# Patient Record
Sex: Male | Born: 1950 | Race: White | Hispanic: No | Marital: Married | Smoking: Never smoker
Health system: Southern US, Community
[De-identification: ages and names within clinical notes are randomized; demographics above are authoritative.]

## PROBLEM LIST (undated history)

## (undated) DIAGNOSIS — M1990 Unspecified osteoarthritis, unspecified site: Secondary | ICD-10-CM

## (undated) DIAGNOSIS — F32A Depression, unspecified: Secondary | ICD-10-CM

## (undated) DIAGNOSIS — F329 Major depressive disorder, single episode, unspecified: Secondary | ICD-10-CM

## (undated) HISTORY — DX: Depression, unspecified: F32.A

## (undated) HISTORY — DX: Major depressive disorder, single episode, unspecified: F32.9

## (undated) HISTORY — DX: Unspecified osteoarthritis, unspecified site: M19.90

## (undated) HISTORY — PX: APPENDECTOMY: SHX54

---

## 2005-07-12 ENCOUNTER — Ambulatory Visit: Payer: Self-pay | Admitting: Family Medicine

## 2006-03-14 ENCOUNTER — Ambulatory Visit: Payer: Self-pay | Admitting: Family Medicine

## 2008-09-30 ENCOUNTER — Ambulatory Visit: Payer: Self-pay | Admitting: Family Medicine

## 2008-09-30 DIAGNOSIS — F329 Major depressive disorder, single episode, unspecified: Secondary | ICD-10-CM

## 2008-09-30 DIAGNOSIS — S139XXA Sprain of joints and ligaments of unspecified parts of neck, initial encounter: Secondary | ICD-10-CM

## 2008-09-30 DIAGNOSIS — J45909 Unspecified asthma, uncomplicated: Secondary | ICD-10-CM | POA: Insufficient documentation

## 2008-09-30 DIAGNOSIS — IMO0002 Reserved for concepts with insufficient information to code with codable children: Secondary | ICD-10-CM | POA: Insufficient documentation

## 2008-11-20 ENCOUNTER — Ambulatory Visit: Payer: Self-pay | Admitting: Family Medicine

## 2008-11-20 DIAGNOSIS — M1990 Unspecified osteoarthritis, unspecified site: Secondary | ICD-10-CM | POA: Insufficient documentation

## 2009-09-04 ENCOUNTER — Telehealth: Payer: Self-pay | Admitting: Family Medicine

## 2011-01-02 LAB — CONVERTED CEMR LAB
ALT: 30 units/L (ref 0–53)
AST: 31 units/L (ref 0–37)
Albumin: 4.4 g/dL (ref 3.5–5.2)
Alkaline Phosphatase: 67 units/L (ref 39–117)
BUN: 16 mg/dL (ref 6–23)
Basophils Relative: 0.1 % (ref 0.0–3.0)
CO2: 32 meq/L (ref 19–32)
Chloride: 104 meq/L (ref 96–112)
Creatinine, Ser: 0.9 mg/dL (ref 0.4–1.5)
Eosinophils Absolute: 0.1 10*3/uL (ref 0.0–0.7)
Eosinophils Relative: 2.6 % (ref 0.0–5.0)
GFR calc non Af Amer: 93 mL/min
Glucose, Bld: 105 mg/dL — ABNORMAL HIGH (ref 70–99)
HCT: 42.2 % (ref 39.0–52.0)
MCV: 95.8 fL (ref 78.0–100.0)
Monocytes Relative: 10.3 % (ref 3.0–12.0)
Neutrophils Relative %: 66.8 % (ref 43.0–77.0)
PSA: 0.95 ng/mL (ref 0.10–4.00)
Platelets: 211 10*3/uL (ref 150–400)
Potassium: 4.4 meq/L (ref 3.5–5.1)
RBC: 4.41 M/uL (ref 4.22–5.81)
Total CHOL/HDL Ratio: 3.1
Total Protein: 7.5 g/dL (ref 6.0–8.3)
VLDL: 8 mg/dL (ref 0–40)
WBC: 5.4 10*3/uL (ref 4.5–10.5)

## 2011-01-07 ENCOUNTER — Encounter: Payer: Self-pay | Admitting: Family Medicine

## 2011-01-07 ENCOUNTER — Ambulatory Visit (INDEPENDENT_AMBULATORY_CARE_PROVIDER_SITE_OTHER): Payer: BC Managed Care – PPO | Admitting: Family Medicine

## 2011-01-07 VITALS — BP 126/84 | HR 68 | Temp 98.3°F | Wt 164.0 lb

## 2011-01-07 DIAGNOSIS — J40 Bronchitis, not specified as acute or chronic: Secondary | ICD-10-CM

## 2011-01-07 MED ORDER — AZITHROMYCIN 250 MG PO TABS: ORAL_TABLET | ORAL | Status: AC

## 2011-01-07 NOTE — Progress Notes (Signed)
  Subjective:    Patient ID: Donald Melendez, male    DOB: 04-18-1951, 60 y.o.   MRN: [id number]  HPI    Review of Systems     Objective:   Physical Exam        Assessment & Plan:

## 2011-02-25 ENCOUNTER — Ambulatory Visit (INDEPENDENT_AMBULATORY_CARE_PROVIDER_SITE_OTHER): Payer: BC Managed Care – PPO | Admitting: Family Medicine

## 2011-02-25 ENCOUNTER — Encounter: Payer: Self-pay | Admitting: Family Medicine

## 2011-02-25 DIAGNOSIS — J45909 Unspecified asthma, uncomplicated: Secondary | ICD-10-CM

## 2011-02-25 MED ORDER — ALBUTEROL SULFATE HFA 108 (90 BASE) MCG/ACT IN AERS: 2.0000 | INHALATION_SPRAY | RESPIRATORY_TRACT | Status: DC | PRN

## 2011-02-25 MED ORDER — FLUTICASONE PROPIONATE HFA 220 MCG/ACT IN AERO: 2.0000 | INHALATION_SPRAY | Freq: Two times a day (BID) | RESPIRATORY_TRACT | Status: DC

## 2011-02-25 NOTE — Progress Notes (Signed)
  Subjective:    Patient ID: Donald Melendez, male    DOB: 11-01-1951, 60 y.o.   MRN: [id number]  HPI Here for med refills for his asthma. He has not used prescription inhalers or over a year. He used Primatene OTC for awhile last year until this was taken off the market. He has been getting mildly SOB when he works outside lately and has a dry cough. No chest pain.    Review of Systems  Constitutional: Negative.   Respiratory: Positive for cough, chest tightness, shortness of breath and wheezing. Negative for apnea, choking and stridor.   Cardiovascular: Negative.        Objective:   Physical Exam  Constitutional: He appears well-developed and well-nourished.  Cardiovascular: Normal rate, regular rhythm, normal heart sounds and intact distal pulses.  Exam reveals no gallop and no friction rub.   No murmur heard. Pulmonary/Chest: Effort normal and breath sounds normal. No respiratory distress. He has no wheezes. He has no rales. He exhibits no tenderness.          Assessment & Plan:  Get back on adequate meds for the asthma. Set up a cpx soon

## 2016-10-09 ENCOUNTER — Emergency Department (HOSPITAL_COMMUNITY)
Admission: EM | Admit: 2016-10-09 | Discharge: 2016-10-09 | Disposition: A | Discharge status: Home / Self Care | Payer: Self-pay | Attending: Emergency Medicine | Admitting: Emergency Medicine

## 2016-10-09 ENCOUNTER — Emergency Department (HOSPITAL_COMMUNITY): Payer: Self-pay

## 2016-10-09 ENCOUNTER — Encounter (HOSPITAL_COMMUNITY): Payer: Self-pay | Admitting: Emergency Medicine

## 2016-10-09 DIAGNOSIS — J45909 Unspecified asthma, uncomplicated: Secondary | ICD-10-CM | POA: Insufficient documentation

## 2016-10-09 DIAGNOSIS — R0602 Shortness of breath: Secondary | ICD-10-CM | POA: Insufficient documentation

## 2016-10-09 DIAGNOSIS — Y929 Unspecified place or not applicable: Secondary | ICD-10-CM | POA: Insufficient documentation

## 2016-10-09 DIAGNOSIS — W19XXXA Unspecified fall, initial encounter: Secondary | ICD-10-CM

## 2016-10-09 DIAGNOSIS — W11XXXA Fall on and from ladder, initial encounter: Secondary | ICD-10-CM | POA: Insufficient documentation

## 2016-10-09 DIAGNOSIS — Y999 Unspecified external cause status: Secondary | ICD-10-CM | POA: Insufficient documentation

## 2016-10-09 DIAGNOSIS — R071 Chest pain on breathing: Secondary | ICD-10-CM | POA: Insufficient documentation

## 2016-10-09 DIAGNOSIS — Z79899 Other long term (current) drug therapy: Secondary | ICD-10-CM | POA: Insufficient documentation

## 2016-10-09 DIAGNOSIS — Y939 Activity, unspecified: Secondary | ICD-10-CM | POA: Insufficient documentation

## 2016-10-09 LAB — SAMPLE TO BLOOD BANK

## 2016-10-09 LAB — I-STAT CHEM 8, ED
BUN: 14 mg/dL (ref 6–20)
CALCIUM ION: 1.13 mmol/L — AB (ref 1.15–1.40)
CHLORIDE: 101 mmol/L (ref 101–111)
CREATININE: 1.1 mg/dL (ref 0.61–1.24)
GLUCOSE: 123 mg/dL — AB (ref 65–99)
HCT: 42 % (ref 39.0–52.0)
Hemoglobin: 14.3 g/dL (ref 13.0–17.0)
Potassium: 3.5 mmol/L (ref 3.5–5.1)
Sodium: 142 mmol/L (ref 135–145)
TCO2: 27 mmol/L (ref 0–100)

## 2016-10-09 LAB — CBC
HCT: 39.9 % (ref 39.0–52.0)
HEMOGLOBIN: 14 g/dL (ref 13.0–17.0)
MCH: 32.8 pg (ref 26.0–34.0)
MCHC: 35.1 g/dL (ref 30.0–36.0)
MCV: 93.4 fL (ref 78.0–100.0)
PLATELETS: 168 10*3/uL (ref 150–400)
RBC: 4.27 MIL/uL (ref 4.22–5.81)
RDW: 12.8 % (ref 11.5–15.5)
WBC: 5.7 10*3/uL (ref 4.0–10.5)

## 2016-10-09 LAB — COMPREHENSIVE METABOLIC PANEL
ALK PHOS: 62 U/L (ref 38–126)
ALT: 42 U/L (ref 17–63)
ANION GAP: 8 (ref 5–15)
AST: 47 U/L — ABNORMAL HIGH (ref 15–41)
Albumin: 4.6 g/dL (ref 3.5–5.0)
BILIRUBIN TOTAL: 0.6 mg/dL (ref 0.3–1.2)
BUN: 14 mg/dL (ref 6–20)
CALCIUM: 9.1 mg/dL (ref 8.9–10.3)
CO2: 27 mmol/L (ref 22–32)
CREATININE: 1.03 mg/dL (ref 0.61–1.24)
Chloride: 104 mmol/L (ref 101–111)
Glucose, Bld: 125 mg/dL — ABNORMAL HIGH (ref 65–99)
Potassium: 3.4 mmol/L — ABNORMAL LOW (ref 3.5–5.1)
SODIUM: 139 mmol/L (ref 135–145)
TOTAL PROTEIN: 7.9 g/dL (ref 6.5–8.1)

## 2016-10-09 LAB — URINALYSIS, ROUTINE W REFLEX MICROSCOPIC
Bilirubin Urine: NEGATIVE
Glucose, UA: NEGATIVE mg/dL
KETONES UR: NEGATIVE mg/dL
LEUKOCYTES UA: NEGATIVE
NITRITE: NEGATIVE
PROTEIN: NEGATIVE mg/dL
Specific Gravity, Urine: 1.015 (ref 1.005–1.030)
pH: 6 (ref 5.0–8.0)

## 2016-10-09 LAB — URINE MICROSCOPIC-ADD ON

## 2016-10-09 LAB — I-STAT CG4 LACTIC ACID, ED: LACTIC ACID, VENOUS: 1.32 mmol/L (ref 0.5–1.9)

## 2016-10-09 LAB — ETHANOL

## 2016-10-09 LAB — PROTIME-INR
INR: 0.88
Prothrombin Time: 11.9 seconds (ref 11.4–15.2)

## 2016-10-09 MED ORDER — OXYCODONE-ACETAMINOPHEN 5-325 MG PO TABS: 1.0000 | ORAL_TABLET | ORAL | 0 refills | Status: DC | PRN

## 2016-10-09 MED ORDER — SODIUM CHLORIDE 0.9 % IV SOLN: INTRAVENOUS | Status: DC

## 2016-10-09 MED ORDER — SODIUM CHLORIDE 0.9 % IV BOLUS (SEPSIS)
1000.0000 mL | Freq: Once | INTRAVENOUS | Status: AC
  Administered 2016-10-09: 1000 mL via INTRAVENOUS

## 2016-10-09 MED ORDER — HYDROMORPHONE HCL 1 MG/ML IJ SOLN
1.0000 mg | Freq: Once | INTRAMUSCULAR | Status: AC
  Administered 2016-10-09: 1 mg via INTRAVENOUS
  Filled 2016-10-09: qty 1

## 2016-10-09 MED ORDER — IOPAMIDOL (ISOVUE-300) INJECTION 61%
75.0000 mL | Freq: Once | INTRAVENOUS | Status: AC | PRN
  Administered 2016-10-09: 75 mL via INTRAVENOUS

## 2016-10-09 MED ORDER — OXYCODONE-ACETAMINOPHEN 5-325 MG PO TABS
1.0000 | ORAL_TABLET | Freq: Once | ORAL | Status: AC
Start: 2016-10-09 — End: 2016-10-09
  Administered 2016-10-09: 1 via ORAL
  Filled 2016-10-09: qty 1

## 2016-10-09 NOTE — ED Triage Notes (Signed)
Pt reports L chest pain (with mvmt) after falling off a ladder. Family reports pt fell on his L side.

## 2016-10-09 NOTE — ED Provider Notes (Signed)
[facility] Provider Note   CSN: [id number] Arrival date & time: 10/09/16  1729     History   Chief Complaint Chief Complaint  Patient presents with  . Fall    HPI Donald Melendez is a 65 y.o. male With a history of asthma and osteoarthritis who presents with a fall. Patient reports that he was on an 8 foot ladder when he fell from the top. He reports that he landed on to soft ground onto his left chest. He denies loss of consciousness or hitting his head. His primary complaint is pain in the left torso. Patient denies headache, vision changes, abdominal pain, extremity pain, lightheadedness. He does report shortness of breath and severe pain with deep breathing. He denies any history of fractures or serious trauma. He has not taken any medicine and came straight to the emergency department. He denies any anticoagulation use. 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     The history is provided by the patient, the spouse and medical records. No language interpreter was used.  Fall  This is a new problem. The current episode started less than 1 hour ago. The problem occurs rarely. The problem has not changed since onset.Associated symptoms include chest pain and shortness of breath. Pertinent negatives include no abdominal pain and no headaches. The symptoms are aggravated by bending and coughing. The symptoms are relieved by rest. He has tried nothing for the symptoms. The treatment provided no relief.    Past Medical History:  Diagnosis Date  . Asthma   . Depression   . Osteoarthritis     Patient Active Problem List   Diagnosis Date Noted  . OSTEOARTHRITIS 11/20/2008  . DEPRESSION 09/30/2008  . ASTHMA 09/30/2008  . KNEE SPRAIN 09/30/2008  . NECK SPRAIN AND STRAIN 09/30/2008    Past Surgical History:  Procedure Laterality Date  . APPENDECTOMY         Home Medications    Prior to Admission medications     Medication Sig Start Date End Date Taking? Authorizing Provider  albuterol (VENTOLIN HFA) 108 (90 BASE) MCG/ACT inhaler Inhale 2 puffs into the lungs every 4 (four) hours as needed for shortness of breath. 02/25/11   Nelwyn Salisbury, MD  etodolac (LODINE) 500 MG tablet Take 500 mg by mouth 2 (two) times daily.      Historical Provider, MD  fluticasone (FLOVENT HFA) 220 MCG/ACT inhaler Inhale 2 puffs into the lungs 2 (two) times daily. 02/25/11 02/25/12  Nelwyn Salisbury, MD  ketoconazole (NIZORAL) 200 MG tablet Take 200 mg by mouth 2 (two) times daily.      Historical Provider, MD  Multiple Vitamin (MULTIVITAMIN) tablet Take 1 tablet by mouth daily.      Historical Provider, MD    Family History Family History  Problem Relation Age of Onset  . Hypertension      family hx    Social History Social History  Substance Use Topics  . Smoking status: Never Smoker  . Smokeless tobacco: Never Used  . Alcohol use No     Allergies   Patient has no known allergies.   Review of Systems Review of Systems  Constitutional: Negative for activity change, chills, diaphoresis, fatigue and fever.  HENT: Negative for congestion and rhinorrhea.   Eyes: Negative for visual disturbance.  Respiratory: Positive for chest tightness and shortness of breath. Negative for cough, choking, wheezing and stridor.   Cardiovascular: Positive for chest  pain. Negative for palpitations and leg swelling.  Gastrointestinal: Negative for abdominal distention, abdominal pain, blood in stool, constipation, diarrhea, nausea and vomiting.  Genitourinary: Negative for difficulty urinating, dysuria, flank pain and frequency.  Musculoskeletal: Negative for back pain and gait problem.  Skin: Negative for rash and wound.  Neurological: Negative for dizziness, weakness, light-headedness and headaches.  Psychiatric/Behavioral: Negative for agitation.  All other systems reviewed and are negative.    Physical Exam Updated Vital  Signs BP 115/81 (BP Location: Left Arm)   Pulse 74   Temp 97.6 F (36.4 C) (Oral)   Resp 20   Ht 5\' 8"  (1.727 m)   Wt 160 lb (72.6 kg)   SpO2 97%   BMI 24.33 kg/m   Physical Exam  Constitutional: He appears well-developed and well-nourished.  HENT:  Head: Normocephalic and atraumatic.  Mouth/Throat: Oropharynx is clear and moist. No oropharyngeal exudate.  Eyes: Conjunctivae and EOM are normal. Pupils are equal, round, and reactive to light. No scleral icterus.  Neck: Neck supple.  Cardiovascular: Normal rate, regular rhythm and normal heart sounds.   No murmur heard. Pulmonary/Chest: Effort normal and breath sounds normal. No respiratory distress. He has no wheezes. He has no rhonchi. He has no rales.     He exhibits tenderness. He exhibits no laceration, no crepitus and no deformity.    Abdominal: Soft. There is no tenderness.  Musculoskeletal: He exhibits tenderness. He exhibits no edema.  Neurological: He is alert. He has normal strength. He is not disoriented. No sensory deficit. He exhibits normal muscle tone. Coordination normal. GCS eye subscore is 4. GCS verbal subscore is 5. GCS motor subscore is 6.  Skin: Skin is warm and dry. Capillary refill takes less than 2 seconds. No erythema. No pallor.  Psychiatric: He has a normal mood and affect.  Nursing note and vitals reviewed.    ED Treatments / Results  Labs (all labs ordered are listed, but only abnormal results are displayed) Labs Reviewed  COMPREHENSIVE METABOLIC PANEL - Abnormal; Notable for the following:       Result Value   Potassium 3.4 (*)    Glucose, Bld 125 (*)    AST 47 (*)    All other components within normal limits  URINALYSIS, ROUTINE W REFLEX MICROSCOPIC (NOT AT [facility]) - Abnormal; Notable for the following:    Hgb urine dipstick TRACE (*)    All other components within normal limits  URINE MICROSCOPIC-ADD ON - Abnormal; Notable for the following:    Squamous Epithelial / LPF 0-5 (*)     Bacteria, UA RARE (*)    All other components within normal limits  I-STAT CHEM 8, ED - Abnormal; Notable for the following:    Glucose, Bld 123 (*)    Calcium, Ion 1.13 (*)    All other components within normal limits  CBC  ETHANOL  PROTIME-INR  I-STAT CG4 LACTIC ACID, ED  SAMPLE TO BLOOD BANK    EKG  EKG Interpretation None       Radiology Ct Chest W Contrast  Result Date: 11/5/201[facility] [phone] Simmers8295oe[facility]nnstp[facility] Center - Los [phone] Si62 evidence of thoracic aortic injury or mediastinal hematoma. No pericardial effusion. Mediastinum/Nodes: No masses or pathologically enlarged lymph nodes identified. Lungs/Pleura: No evidence of pulmonary contusion or other infiltrate.  Mild bibasilar atelectasis noted. No evidence of pneumothorax or hemothorax. Upper Abdomen:  Small hiatal hernia. Musculoskeletal: No acute fractures or suspicious bone lesions identified. IMPRESSION: No acute traumatic injury identified. Small hiatal hernia. Mild bibasilar atelectasis. Electronically Signed   By: Myles RosenthalJohn  Stahl M.D.   On: 10/09/2016 19:46   Dg Chest Portable 1 View  Result Date: 10/09/2016 CLINICAL DATA:  Left-sided chest pain and shortness of breath. Larey SeatFell off of a ladder today. EXAM: PORTABLE CHEST 1 VIEW COMPARISON:  None. FINDINGS: The heart is borderline enlarged with a left ventricular configuration. There is mild tortuosity of the thoracic aorta. Low lung volumes with vascular crowding and atelectasis. No definite infiltrates, effusions or pneumothorax. The bony thorax is grossly intact. No obvious rib fractures. IMPRESSION: No acute cardiopulmonary findings and grossly intact bony thorax. Electronically Signed   By: Rudie MeyerP.  Gallerani M.D.   On:  10/09/2016 19:10    Procedures Procedures (including critical care time)  Medications Ordered in ED Medications  sodium chloride 0.9 % bolus 1,000 mL (0 mLs Intravenous Stopped 10/09/16 1936)  HYDROmorphone (DILAUDID) injection 1 mg (1 mg Intravenous Given 10/09/16 1836)  iopamidol (ISOVUE-300) 61 % injection 75 mL (75 mLs Intravenous Contrast Given 10/09/16 1920)  oxyCODONE-acetaminophen (PERCOCET/ROXICET) 5-325 MG per tablet 1 tablet (1 tablet Oral Given 10/09/16 2110)     Initial Impression / Assessment and Plan / ED Course  I have reviewed the triage vital signs and the nursing notes.  Pertinent labs & imaging results that were available during my care of the patient were reviewed by me and considered in my medical decision making (see chart for details).  Clinical Course     Donald Melendez is a 65 y.o. male With a history of asthma and osteoarthritis who presents with a fall.  History and exam are seen above.  On exam, patient had tenderness in the left anterior lateral and posterior chest. Breath sounds were symmetric but he had poor effort due to pain. Pulses were symmetric, abdomen was nontender, airway was intact, breath sounds were equal, and patient was not hypotensive.   Patient was not hypotensive, tachycardic, or hypoxic on initial evaluation. Suspected musculoskeletal injury including possible refreshers based on mechanism and description of symptoms. Portable chest x-ray was obtained and no pneumothorax will refreshers were appreciated at the bedside.  Pain medicine and fluids were given. Patient had improvement with pain and deeper inspiration when pain was better controled.   CT scan of the chest was ordered to look for pneumothorax or refractors that were missed on x-ray. No evidence of acute trauma was seen.  Given lack of other symptoms, patient demonstrating stability in ED in regards to vital signs, and improvement in pain, feel patient had likely soft  tissue injury to left chest. Patient was observed for period of time with no worsening.  After period of observation, patient was felt appropriate for discharge. Patient was given incentive Spirometer and instructed on use by Respiratory therapy. In-depth discussion was held about risks of worsening lung injury, pulmonary contusion appearing,  Developing pneumonia, and importance of spirometer use. Patient also was given instructions to return if any new symptoms arose concerning for other dramatic injury. Patient and family clearly understood return precautions and patient also instructed to follow up with PCP.  Patient given prescription for pain medicine. Patient had no other questions or concerns and patient was discharged in good condition.    Final Clinical Impressions(s) / ED Diagnoses   Final diagnoses:  Fall, initial  encounter  Chest pain on breathing    New Prescriptions Discharge Medication List as of 10/09/2016  8:49 PM    START taking these medications   Details  oxyCODONE-acetaminophen (PERCOCET/ROXICET) 5-325 MG tablet Take 1 tablet by mouth every 4 (four) hours as needed for severe pain., Starting Sun 10/09/2016, Print        Clinical Impression: 1. Fall, initial encounter   2. Chest pain on breathing     Disposition: Discharge  Condition: Good  I have discussed the results, Dx and Tx plan with the pt(& family if present). He/she/they expressed understanding and agree(s) with the plan. Discharge instructions discussed at great length. Strict return precautions discussed and pt &/or family have verbalized understanding of the instructions. No further questions at time of discharge.    Discharge Medication List as of 10/09/2016  8:49 PM    START taking these medications   Details  oxyCODONE-acetaminophen (PERCOCET/ROXICET) 5-325 MG tablet Take 1 tablet by mouth every 4 (four) hours as needed for severe pain., Starting Sun 10/09/2016, Print        Follow  Up: [facility] [address] [place] [address] [phone]       Heide Scales, MD 10/10/16 [phone]

## 2016-10-09 NOTE — Discharge Instructions (Signed)
Please use your incentive spirometer to prevent lung collapse or lung infections. Please follow-up with a primary physician for further management of your injury. If symptoms arise or worsen, please return to the nearest emergency department for further evaluation.

## 2016-10-09 NOTE — ED Notes (Signed)
MD at the bedside to explain results

## 2016-10-09 NOTE — ED Notes (Signed)
Pt back from CT

## 2016-10-09 NOTE — ED Notes (Signed)
Patient given discharge instruction, verbalized understand. IV removed, band aid applied. Patient ambulatory out of the department.  

## 2017-07-24 ENCOUNTER — Ambulatory Visit (INDEPENDENT_AMBULATORY_CARE_PROVIDER_SITE_OTHER): Payer: Self-pay | Admitting: Physician Assistant

## 2017-07-24 ENCOUNTER — Encounter: Payer: Self-pay | Admitting: Physician Assistant

## 2017-07-24 VITALS — BP 142/86 | HR 81 | Temp 97.9°F | Resp 16 | Ht 66.0 in | Wt 167.0 lb

## 2017-07-24 DIAGNOSIS — Z0000 Encounter for general adult medical examination without abnormal findings: Secondary | ICD-10-CM

## 2017-07-24 DIAGNOSIS — J309 Allergic rhinitis, unspecified: Secondary | ICD-10-CM | POA: Insufficient documentation

## 2017-07-24 DIAGNOSIS — J4520 Mild intermittent asthma, uncomplicated: Secondary | ICD-10-CM

## 2017-07-24 DIAGNOSIS — J45909 Unspecified asthma, uncomplicated: Secondary | ICD-10-CM | POA: Insufficient documentation

## 2017-07-24 DIAGNOSIS — I10 Essential (primary) hypertension: Secondary | ICD-10-CM

## 2017-07-24 DIAGNOSIS — J301 Allergic rhinitis due to pollen: Secondary | ICD-10-CM

## 2017-07-24 NOTE — Progress Notes (Signed)
Patient ID: Donald Melendez MRN: [id number], DOB: 17-Jan-1951, 66 y.o. Date of Encounter: @DATE @  Chief Complaint:  Chief Complaint  Patient presents with  . New Patient (Initial Visit)    HPI: 66 y.o. year old male  presents as a new patient to establish care.  I just saw his wife as a new patient to establish care as well. She is actually the mother of Donald Melendez---I see the Melendez family--I have seen her as well as the husband and children for visits. The Melendez family just recently moved down here from [place] in the past 1-2 years.  Donald Melendez' mom ([name]) said that she had lived back and forth from [place] to [place] for many years. Reported that she got a divorce and after the divorce moved to the [place] area because she has multiple siblings that live in that area and have farm there.  Patient states that he works in [employer] and has worked that same job for 20 years. Says that a friend introduced him to his wife. They got married in 2012. Patient states that he grew up living in [place] and [place] then as an adult is lived in [place] then [place] and now is in [place].  He reports that 10 years ago he would go to a medical provider for complete physicals as well as acute visits. However, has had no medical care in the past 10 years except for when he had a fall.  He reports that he starts Medicare coverage in September. Currently has no insurance coverage.  He reports he has history of some seasonal allergies and also seasonal asthma. Says that he has been considering starting back to doing some running but is concerned that he will need to use an albuterol inhaler and does not have one on hand. States that he ran in high school and college and also ran through adulthood up into his 4s. Says that he currently is active with hiking with his wife. 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 Notes that he is concerned that if he is  running through the woods and around some allergens then he may have some wheezing. Currently has used over-the-counter medicines as needed for allergy symptoms.  He knows of no other medical history or medical concerns to address today.   Past Medical History:  Diagnosis Date  . Asthma   . Depression   . Osteoarthritis      Home Meds: Outpatient Medications Prior to Visit  Medication Sig Dispense Refill  . Ibuprofen-Diphenhydramine Cit (IBUPROFEN PM PO) Take 1 tablet by mouth at bedtime as needed (sleep).    . Multiple Vitamin (MULTIVITAMIN) tablet Take 1 tablet by mouth daily.      . Omega-3 Fatty Acids (FISH OIL) 1000 MG CAPS Take 1 capsule by mouth daily.    Marland Kitchen oxyCODONE-acetaminophen (PERCOCET/ROXICET) 5-325 MG tablet Take 1 tablet by mouth every 4 (four) hours as needed for severe pain. 25 tablet 0  . vitamin C (ASCORBIC ACID) 500 MG tablet Take 500 mg by mouth daily.     No facility-administered medications prior to visit.     Allergies: No Known Allergies  Social History   Social History  . Marital status: Married    Spouse name: N/A  . Number of children: N/A  . Years of education: N/A   Occupational History  . Not on file.   Social History Main Topics  . Smoking status: Never Smoker  .  Smokeless tobacco: Never Used  . Alcohol use No  . Drug use: No  . Sexual activity: Not on file   Other Topics Concern  . Not on file   Social History Narrative  . No narrative on file    Family History  Problem Relation Age of Onset  . Hypertension Unknown        family hx  . Hypertension Mother   . Hypertension Father   . Cancer Maternal Grandfather      Review of Systems:  See HPI for pertinent ROS. All other ROS negative.    Physical Exam: Blood pressure (!) 142/86, pulse 81, temperature 97.9 F (36.6 C), temperature source Oral, resp. rate 16, height 5\' 6"  (1.676 m), weight 167 lb (75.8 kg), SpO2 98 %., Body mass index is 26.95 kg/m. General: WNWD WM.  Appears in no acute distress. Head: Normocephalic, atraumatic, eyes without discharge, sclera non-icteric, nares are without discharge. Bilateral auditory canals clear, TM's are without perforation, pearly grey and translucent with reflective cone of light bilaterally. Oral cavity moist, posterior pharynx without exudate, erythema.  Neck: Supple. No thyromegaly. No lymphadenopathy. No carotid bruits. Lungs: Clear bilaterally to auscultation without wheezes, rales, or rhonchi. Breathing is unlabored. Heart: RRR with S1 S2. No murmurs, rubs, or gallops. Abdomen: Soft, non-tender, non-distended with normoactive bowel sounds. No hepatomegaly. No rebound/guarding. No obvious abdominal masses. Musculoskeletal:  Strength and tone normal for age. Extremities/Skin: Warm and dry. No LE edema.  Neuro: Alert and oriented X 3. Moves all extremities spontaneously. Gait is normal. CNII-XII grossly in tact. Psych:  Responds to questions appropriately with a normal affect.     ASSESSMENT AND PLAN:  66 y.o. year old male with   1. Encounter for medical examination to establish care  2. Seasonal allergic rhinitis due to pollen  3. Mild intermittent asthma without complication  4. Essential hypertension I rechecked blood pressure myself and hear it very clearly. On the left am getting 140/90 and on the right 134/90.  He reports that he is meeting with the insurance agent tomorrow.  Will verify the exact date that his insurance coverage will start and will make sure that his next appointment/CPE/ MEDICARE PHYSICAL is scheduled once he has insurance coverage.  He is pretty certain it starts in the beginning of September but will plan to schedule this mid-September to be sure and then also will check with the insurance agent. Will schedule this CPE for early morning and I have told him to come fasting to that appointment. At that appointment we will cover CPE but also will recheck blood pressure and if greater  than 135/85 then will start blood pressure medicine. At that appointment also will prescribe Zyrtec and albuterol.   Signed, [address] [place], [place], [facility] 07/24/2017 10:03 AM

## 2017-08-23 ENCOUNTER — Other Ambulatory Visit: Payer: Self-pay

## 2017-08-23 ENCOUNTER — Other Ambulatory Visit: Payer: Self-pay | Admitting: Family Medicine

## 2017-08-23 DIAGNOSIS — Z125 Encounter for screening for malignant neoplasm of prostate: Secondary | ICD-10-CM

## 2017-08-23 DIAGNOSIS — F329 Major depressive disorder, single episode, unspecified: Secondary | ICD-10-CM

## 2017-08-23 DIAGNOSIS — Z0000 Encounter for general adult medical examination without abnormal findings: Secondary | ICD-10-CM

## 2017-08-23 DIAGNOSIS — F32A Depression, unspecified: Secondary | ICD-10-CM

## 2017-08-23 DIAGNOSIS — I10 Essential (primary) hypertension: Secondary | ICD-10-CM

## 2017-08-23 DIAGNOSIS — Z79899 Other long term (current) drug therapy: Secondary | ICD-10-CM

## 2017-08-24 ENCOUNTER — Other Ambulatory Visit: Payer: Self-pay | Admitting: Family Medicine

## 2017-08-24 ENCOUNTER — Other Ambulatory Visit: Payer: Self-pay

## 2017-08-24 DIAGNOSIS — Z114 Encounter for screening for human immunodeficiency virus [HIV]: Secondary | ICD-10-CM

## 2017-08-24 DIAGNOSIS — Z1159 Encounter for screening for other viral diseases: Secondary | ICD-10-CM

## 2017-08-25 ENCOUNTER — Other Ambulatory Visit: Payer: Medicare Other

## 2017-08-25 DIAGNOSIS — Z79899 Other long term (current) drug therapy: Secondary | ICD-10-CM

## 2017-08-25 DIAGNOSIS — F32A Depression, unspecified: Secondary | ICD-10-CM

## 2017-08-25 DIAGNOSIS — Z0000 Encounter for general adult medical examination without abnormal findings: Secondary | ICD-10-CM

## 2017-08-25 DIAGNOSIS — Z125 Encounter for screening for malignant neoplasm of prostate: Secondary | ICD-10-CM

## 2017-08-25 DIAGNOSIS — Z114 Encounter for screening for human immunodeficiency virus [HIV]: Secondary | ICD-10-CM

## 2017-08-25 DIAGNOSIS — I10 Essential (primary) hypertension: Secondary | ICD-10-CM | POA: Diagnosis not present

## 2017-08-25 DIAGNOSIS — Z1159 Encounter for screening for other viral diseases: Secondary | ICD-10-CM | POA: Diagnosis not present

## 2017-08-25 DIAGNOSIS — F329 Major depressive disorder, single episode, unspecified: Secondary | ICD-10-CM | POA: Diagnosis not present

## 2017-08-26 LAB — COMPLETE METABOLIC PANEL WITH GFR
AG RATIO: 1.4 (calc) (ref 1.0–2.5)
ALT: 29 U/L (ref 9–46)
AST: 26 U/L (ref 10–35)
Albumin: 4.3 g/dL (ref 3.6–5.1)
Alkaline phosphatase (APISO): 62 U/L (ref 40–115)
BUN: 14 mg/dL (ref 7–25)
CALCIUM: 9.3 mg/dL (ref 8.6–10.3)
CO2: 26 mmol/L (ref 20–32)
Chloride: 103 mmol/L (ref 98–110)
Creat: 1.07 mg/dL (ref 0.70–1.25)
GFR, EST AFRICAN AMERICAN: 84 mL/min/{1.73_m2} (ref >=60)
GFR, EST NON AFRICAN AMERICAN: 72 mL/min/{1.73_m2} (ref >=60)
GLOBULIN: 3 g/dL (ref 1.9–3.7)
Glucose, Bld: 99 mg/dL (ref 65–99)
POTASSIUM: 4.8 mmol/L (ref 3.5–5.3)
SODIUM: 139 mmol/L (ref 135–146)
TOTAL PROTEIN: 7.3 g/dL (ref 6.1–8.1)
Total Bilirubin: 0.7 mg/dL (ref 0.2–1.2)

## 2017-08-26 LAB — LIPID PANEL
CHOL/HDL RATIO: 3.5 (calc) (ref <5.0)
CHOLESTEROL: 157 mg/dL (ref <200)
HDL: 45 mg/dL (ref >40)
LDL Cholesterol (Calc): 95 mg/dL (calc)
Non-HDL Cholesterol (Calc): 112 mg/dL (calc) (ref <130)
Triglycerides: 80 mg/dL (ref <150)

## 2017-08-26 LAB — CBC WITH DIFFERENTIAL/PLATELET
BASOS ABS: 27 {cells}/uL (ref 0–200)
Basophils Relative: 0.4 %
EOS PCT: 3.3 %
Eosinophils Absolute: 224 cells/uL (ref 15–500)
HCT: 41.6 % (ref 38.5–50.0)
HEMOGLOBIN: 14.3 g/dL (ref 13.2–17.1)
Lymphs Abs: 1217 cells/uL (ref 850–3900)
MCH: 32.5 pg (ref 27.0–33.0)
MCHC: 34.4 g/dL (ref 32.0–36.0)
MCV: 94.5 fL (ref 80.0–100.0)
MONOS PCT: 10.2 %
MPV: 8.8 fL (ref 7.5–12.5)
NEUTROS ABS: 4638 {cells}/uL (ref 1500–7800)
Neutrophils Relative %: 68.2 %
Platelets: 172 10*3/uL (ref 140–400)
RBC: 4.4 10*6/uL (ref 4.20–5.80)
RDW: 13.3 % (ref 11.0–15.0)
Total Lymphocyte: 17.9 %
WBC mixed population: 694 cells/uL (ref 200–950)
WBC: 6.8 10*3/uL (ref 3.8–10.8)

## 2017-08-26 LAB — HIV ANTIBODY (ROUTINE TESTING W REFLEX): HIV: NONREACTIVE

## 2017-08-26 LAB — HEPATITIS C ANTIBODY
Hepatitis C Ab: NONREACTIVE
SIGNAL TO CUT-OFF: 0.01 (ref <1.00)

## 2017-08-26 LAB — PSA: PSA: 1.1 ng/mL (ref <=4.0)

## 2017-08-30 ENCOUNTER — Ambulatory Visit (INDEPENDENT_AMBULATORY_CARE_PROVIDER_SITE_OTHER): Payer: Medicare Other | Admitting: Physician Assistant

## 2017-08-30 ENCOUNTER — Encounter: Payer: Self-pay | Admitting: Physician Assistant

## 2017-08-30 VITALS — BP 128/70 | HR 72 | Temp 98.2°F | Resp 14 | Ht 66.0 in | Wt 166.0 lb

## 2017-08-30 DIAGNOSIS — Z0000 Encounter for general adult medical examination without abnormal findings: Secondary | ICD-10-CM

## 2017-08-30 DIAGNOSIS — I10 Essential (primary) hypertension: Secondary | ICD-10-CM

## 2017-08-30 DIAGNOSIS — J309 Allergic rhinitis, unspecified: Secondary | ICD-10-CM

## 2017-08-30 DIAGNOSIS — J4520 Mild intermittent asthma, uncomplicated: Secondary | ICD-10-CM

## 2017-08-30 MED ORDER — ALBUTEROL SULFATE HFA 108 (90 BASE) MCG/ACT IN AERS: 2.0000 | INHALATION_SPRAY | Freq: Four times a day (QID) | RESPIRATORY_TRACT | 2 refills | Status: DC | PRN

## 2017-08-30 NOTE — Progress Notes (Signed)
Patient ID: Donald Melendez MRN: [id number], DOB: August 31, 1951 66 y.o. Date of Encounter: 08/30/2017, 11:31 AM    Chief Complaint: Physical (CPE)  HPI: 66 y.o. y/o male here for CPE.     07/24/2017: HPI: 66 y.o. year old male  presents as a new patient to establish care.  I just saw his wife as a new patient to establish care as well. She is actually the mother of Donald Melendez---I see the Melendez family--I have seen her as well as the husband and children for visits. The Melendez family just recently moved down here from [place] in the past 1-2 years.  Donald Donald Hight' mom ([name]) said that she had lived back and forth from [place] to [place] for many years. Reported that she got a divorce and after the divorce moved to the [place] area because she has multiple siblings that live in that area and have farm there.  Patient states that he works in [employer] and has worked that same job for 20 years. Says that a friend introduced him to his wife. They got married in 2012. Patient states that he grew up living in [place] and [place] then as an adult is lived in [place] then [place] and now is in [place].  He reports that 10 years ago he would go to a medical provider for complete physicals as well as acute visits. However, has had no medical care in the past 10 years except for when he had a fall.  He reports that he starts Medicare coverage in September. Currently has no insurance coverage.  He reports he has history of some seasonal allergies and also seasonal asthma. Says that he has been considering starting back to doing some running but is concerned that he will need to use an albuterol inhaler and does not have one on hand. States that he ran in high school and college and also ran through adulthood up into his 78s. Says that he currently is active with hiking with his wife. 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 Notes that he is concerned  that if he is running through the woods and around some allergens then he may have some wheezing. Currently has used over-the-counter medicines as needed for allergy symptoms.  He knows of no other medical history or medical concerns to address today.   A/P AT THAT OV: I rechecked blood pressure myself and hear it very clearly. On the left am getting 140/90 and on the right 134/90.  He reports that he is meeting with the insurance agent tomorrow.  Will verify the exact date that his insurance coverage will start and will make sure that his next appointment/CPE/ MEDICARE PHYSICAL is scheduled once he has insurance coverage.  He is pretty certain it starts in the beginning of September but will plan to schedule this mid-September to be sure and then also will check with the insurance agent. Will schedule this CPE for early morning and I have told him to come fasting to that appointment. At that appointment we will cover CPE but also will recheck blood pressure and if greater than 135/85 then will start blood pressure medicine. At that appointment also will prescribe Zyrtec and albuterol.   After that office visit with me on 07/24/17, he returned fasting for labs on 08/25/17.   08/30/2017: Today he presents for complete physical exam.  He has no specific concerns to address today. Just here for his physical and to  get prescription for albuterol. Also discussed sending prescription for Zyrtec for him to use as needed for allergies but he says in the past these types of medicines cause significant drowsiness and he prefers to just use over-the-counter medicines if needed. Also says that usually he has issues with allergens causing his asthma to act up more than causing any sneezing or congestion symptoms.    Review of Systems: Consitutional: No fever, chills, fatigue, night sweats, lymphadenopathy, or weight changes. Eyes: No visual changes, eye redness, or discharge. ENT/Mouth: Ears: No  otalgia, tinnitus, hearing loss, discharge. Nose: No congestion, rhinorrhea, sinus pain, or epistaxis. Throat: No sore throat, post nasal drip, or teeth pain. Cardiovascular: No CP, palpitations, diaphoresis, DOE, edema, orthopnea, PND. Respiratory: No cough, hemoptysis, SOB, or wheezing. Gastrointestinal: No anorexia, dysphagia, reflux, pain, nausea, vomiting, hematemesis, diarrhea, constipation, BRBPR, or melena. Genitourinary: No dysuria, frequency, urgency, hematuria, incontinence, nocturia, decreased urinary stream, discharge, impotence, or testicular pain/masses. Musculoskeletal: No decreased ROM, myalgias, stiffness, joint swelling, or weakness. Skin: No rash, erythema, lesion changes, pain, warmth, jaundice, or pruritis. Neurological: No headache, dizziness, syncope, seizures, tremors, memory loss, coordination problems, or paresthesias. Psychological: No anxiety, depression, hallucinations, SI/HI. Endocrine: No fatigue, polydipsia, polyphagia, polyuria, or known diabetes. All other systems were reviewed and are otherwise negative.  Past Medical History:  Diagnosis Date  . Asthma   . Depression   . Osteoarthritis      Past Surgical History:  Procedure Laterality Date  . APPENDECTOMY      Home Meds:  Outpatient Medications Prior to Visit  Medication Sig Dispense Refill  . Ibuprofen-Diphenhydramine Cit (IBUPROFEN PM PO) Take 1 tablet by mouth at bedtime as needed (sleep).    . Multiple Vitamin (MULTIVITAMIN) tablet Take 1 tablet by mouth daily.      . Omega-3 Fatty Acids (FISH OIL) 1000 MG CAPS Take 1 capsule by mouth daily.    . vitamin C (ASCORBIC ACID) 500 MG tablet Take 500 mg by mouth daily.    Marland Kitchen oxyCODONE-acetaminophen (PERCOCET/ROXICET) 5-325 MG tablet Take 1 tablet by mouth every 4 (four) hours as needed for severe pain. 25 tablet 0   No facility-administered medications prior to visit.     Allergies: No Known Allergies  Social History   Social History  .  Marital status: Married    Spouse name: N/A  . Number of children: N/A  . Years of education: N/A   Occupational History  . Not on file.   Social History Main Topics  . Smoking status: Never Smoker  . Smokeless tobacco: Never Used  . Alcohol use No  . Drug use: No  . Sexual activity: Not on file   Other Topics Concern  . Not on file   Social History Narrative  . No narrative on file    Family History  Problem Relation Age of Onset  . Hypertension Unknown        family hx  . Hypertension Mother   . Hypertension Father   . Cancer Maternal Grandfather     Physical Exam: Blood pressure 128/70, pulse 72, temperature 98.2 F (36.8 C), temperature source Oral, resp. rate 14, height  (1.676 m), weight 75.3 kg (166 lb), SpO2 98 %.  General: Well developed, well nourished WM. Appears in no acute distress. HEENT: Normocephalic, atraumatic. Conjunctiva pink, sclera non-icteric. Pupils 2 mm constricting to 1 mm, round, regular, and equally reactive to light and accomodation. EOMI. Internal auditory canal clear. TMs with good cone of light and without pathology.  Nasal mucosa pink. Nares are without discharge. No sinus tenderness. Oral mucosa pink.  Neck: Supple. Trachea midline. No thyromegaly. Full ROM. No lymphadenopathy. No carotid bruits. Lungs: Clear to auscultation bilaterally without wheezes, rales, or rhonchi. Breathing is of normal effort and unlabored. Cardiovascular: RRR with S1 S2. No murmurs, rubs, or gallops. Distal pulses 2+ symmetrically. No carotid or abdominal bruits. Abdomen: Soft, non-tender, non-distended with normoactive bowel sounds. No hepatosplenomegaly or masses. No rebound/guarding. No CVA tenderness. No hernias. Musculoskeletal: Full range of motion and 5/5 strength throughout.  Skin: Warm and moist without erythema, ecchymosis, wounds, or rash. Neuro: A+Ox3. CN II-XII grossly intact. Moves all extremities spontaneously. Full sensation throughout. Normal  gait. Psych:  Responds to questions appropriately with a normal affect.   Assessment/Plan:  66 y.o. y/o  male here for CPE  -1. Medicare annual wellness visit, subsequent  2. Encounter for preventive health examination  A. Screening Labs: He came for fasting labs 08/25/17. I have reviewed and discussed these findings today. All fasting labs were normal. Lipid panel also was excellent.  B. Screening For Prostate Cancer: 08/25/17 PSA normal. C. Screening For Colorectal Cancer:  Today he reports that he did have one colonoscopy in the past. Initially says that it was 5 years ago and was normal. However after further discussion I am suspecting that it may have been performed > 5 years ago. I discussed that with him and discussed whether follow-up screening may be due and he states that regardless he does not want to do any type of follow-up testing. I discussed that even if he does not want to follow-up for another colonoscopy that there are other test options and discussed Hemoccults and ColoGuard. He voices understanding of risk versus benefits but still refuses/defers any kind of testing.  D. Immunizations: Flu--------- I recommended him get flu vaccine but he defers. He says understanding of risk versus benefits but still defers. Tetanus--this usually is not covered by Medicare so usually did not even discuss with Medicare patients. As well he is refusing any immunizations today. Pneumococcal--- I discussed that now that he is age 18 that he should get pneumonia vaccine. However he defers. Discussed versus risk versus benefits but he still refuses. Zostaax-- However he defers. Discussed versus risk versus benefits but he still refuses.   3. Essential hypertension BP was slightly elevated at last visit but today is normal at 128/70. Does not require any Rx.  4. Allergic rhinitis, unspecified seasonality, unspecified trigger Also discussed sending prescription for Zyrtec for him to use as  needed for allergies but he says in the past these types of medicines cause significant drowsiness and he prefers to just use over-the-counter medicines if needed. Also says that usually he has issues with allergens causing his asthma to act up more than causing any sneezing or congestion symptoms.   5. Mild intermittent asthma without complication Also discussed sending prescription for Zyrtec for him to use as needed for allergies but he says in the past these types of medicines cause significant drowsiness and he prefers to just use over-the-counter medicines if needed. Also says that usually he has issues with allergens causing his asthma to act up more than causing any sneezing or congestion symptoms. - albuterol (PROVENTIL HFA;VENTOLIN HFA) 108 (90 Base) MCG/ACT inhaler; Inhale 2 puffs into the lungs every 6 (six) hours as needed for wheezing or shortness of breath.  Dispense: 1 Inhaler; Refill: 2     Subjective:   Patient presents for Medicare Annual/Subsequent preventive  examination.   Review Past Medical/Family/Social: All of this information is reviewed today. He has no significant family medical history.  Risk Factors  Current exercise habits: He is very active. Currently does walking and hiking but plans to resume his running now. Dietary issues discussed: He eats of healthy low carbohydrate low cholesterol diet.  Cardiac risk factors: Male, Age his only cardiac risk factors. He has no family history and he has excellent lipid panel.  Depression Screen  (Note: if answer to either of the following is "Yes", a more complete depression screening is indicated)  Over the past two weeks, have you felt down, depressed or hopeless? No Over the past two weeks, have you felt little interest or pleasure in doing things? No Have you lost interest or pleasure in daily life? No Do you often feel hopeless? No Do you cry easily over simple problems? No   Activities of Daily Living  In your  present state of health, do you have any difficulty performing the following activities?:  Driving? No  Managing money? No  Feeding yourself? No  Getting from bed to chair? No  Climbing a flight of stairs? No  Preparing food and eating?: No  Bathing or showering? No  Getting dressed: No  Getting to the toilet? No  Using the toilet:No  Moving around from place to place: No  In the past year have you fallen or had a near fall?:No  Are you sexually active? No  Do you have more than one partner? No   Hearing Difficulties: No  Do you often ask people to speak up or repeat themselves? No  Do you experience ringing or noises in your ears? No Do you have difficulty understanding soft or whispered voices? No  Do you feel that you have a problem with memory? No Do you often misplace items? No  Do you feel safe at home? Yes  Cognitive Testing  Alert? Yes Normal Appearance?Yes  Oriented to person? Yes Place? Yes  Time? Yes  Recall of three objects? Yes  Can perform simple calculations? Yes  Displays appropriate judgment?Yes  Can read the correct time from a watch face?Yes   List the Names of Other Physician/Practitioners you currently use:  None  Indicate any recent Medical Services you may have received from other than [facility] providers in the past year (date may be approximate).  None Screening Tests / Date------------ all of this information is documented above. See above. Colonoscopy                     Zostavax  Mammogram  Influenza Vaccine  Tetanus/tdap    Assessment:    Annual wellness medicare exam   Plan:    During the course of the visit the patient was educated and counseled about appropriate screening and preventive services including:  Screening mammography  Colorectal cancer screening  Shingles vaccine. Prescription given to that she can get the vaccine at the pharmacy or Medicare part D.  Screen + for depression. PHQ- 9 score of 12 (moderate depression). We  discussed the options of counseling versus possibly a medication. I encouraged her strongly think about the counseling. She is going through some medical problems currently and her husband is as well Mrs. been very stressful for her. She says she will think about it. She does have Xanax to use as needed. Though she may benefit from an SSRI for her more depressive type symptoms but she wants to hold off at this time.  I aksed her to please have her cardioloist send records since we have none on file.  Diet review for nutrition referral? Yes ____ Not Indicated __x__  Patient Instructions (the written plan) was given to the patient.  Medicare Attestation  I have personally reviewed:  The patient's medical and social history  Their use of alcohol, tobacco or illicit drugs  Their current medications and supplements  The patient's functional ability including ADLs,fall risks, home safety risks, cognitive, and hearing and visual impairment  Diet and physical activities  Evidence for depression or mood disorders  The patient's weight, height, BMI, and visual acuity have been recorded in the chart. I have made referrals, counseling, and provided education to the patient based on review of the above and I have provided the patient with a written personalized care plan for preventive services.      Signed:   [address] [place], [place]  08/30/2017 11:31 AM

## 2017-09-11 ENCOUNTER — Other Ambulatory Visit: Payer: Self-pay | Admitting: *Deleted

## 2017-09-11 DIAGNOSIS — J4520 Mild intermittent asthma, uncomplicated: Secondary | ICD-10-CM

## 2017-09-11 MED ORDER — ALBUTEROL SULFATE HFA 108 (90 BASE) MCG/ACT IN AERS: 2.0000 | INHALATION_SPRAY | Freq: Four times a day (QID) | RESPIRATORY_TRACT | 2 refills | Status: AC | PRN

## 2018-01-01 IMAGING — CR DG CHEST 1V PORT
1 series · 1 of 1 positions shown · non-contrast
Comparison: None.

CLINICAL DATA: Left-sided chest pain and shortness of breath. Fell
off of a ladder today.

EXAM:
PORTABLE CHEST 1 VIEW

[portable]
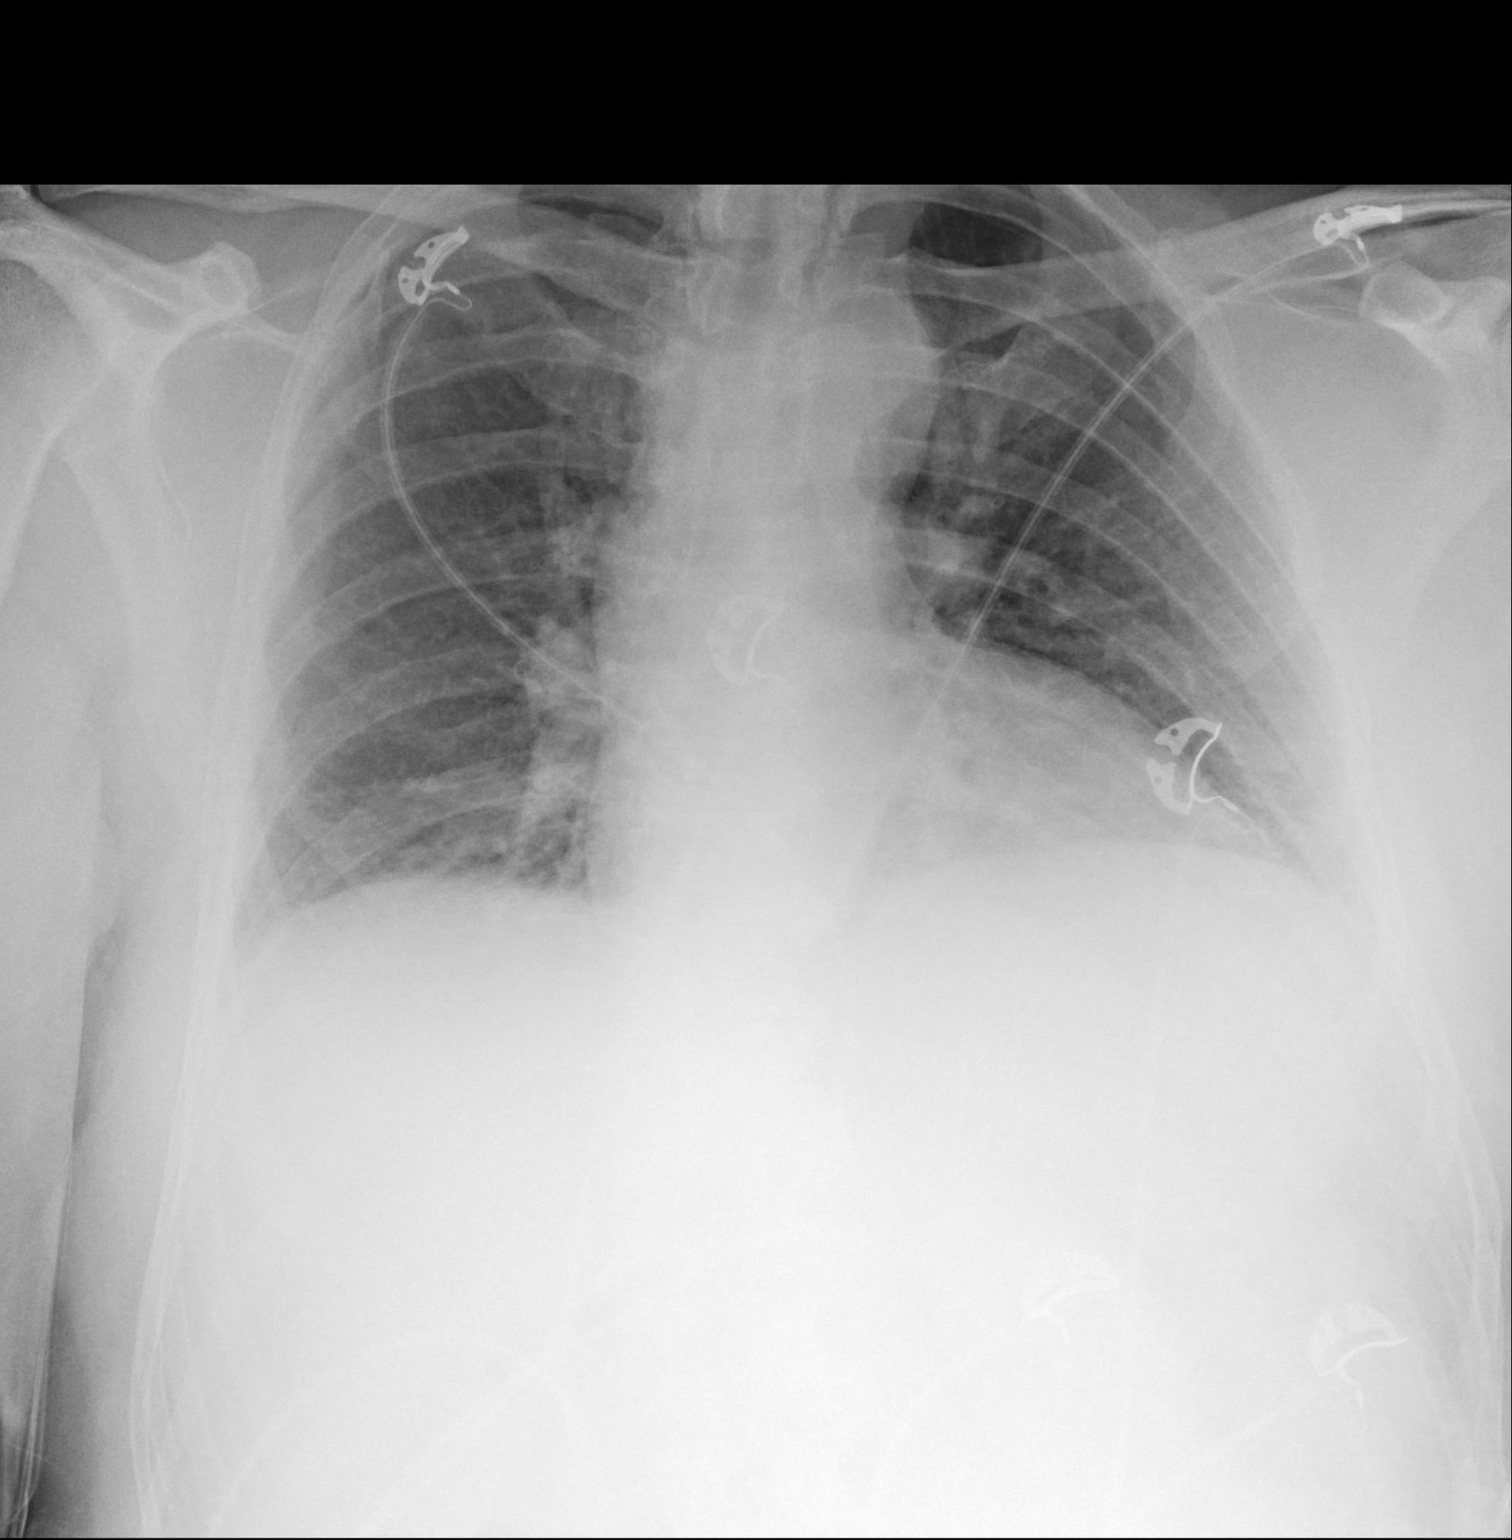

[1 of 1 positions shown; findings below may reference images not displayed]

FINDINGS: The heart is borderline enlarged with a left ventricular
configuration. There is mild tortuosity of the thoracic aorta. Low
lung volumes with vascular crowding and atelectasis. No definite
infiltrates, effusions or pneumothorax. The bony thorax is grossly
intact. No obvious rib fractures.
IMPRESSION: No acute cardiopulmonary findings and grossly intact bony thorax.

## 2018-01-01 IMAGING — CT CT CHEST W/ CM
2 of 3 series · 15 of 36 positions shown, 18 images · IV contrast (iopamidol)
Comparison: None.

CLINICAL DATA: Fell off ladder today. Pleuritic left chest pain.
Asthma.

EXAM:
CT CHEST WITH CONTRAST
TECHNIQUE: Multidetector CT imaging of the chest was performed during
intravenous contrast administration.
CONTRAST:  75mL 2LY9UH-2BB IOPAMIDOL (2LY9UH-2BB) INJECTION 61%

[Series 2: axial st · axial · 0.68mm/px · z∈[-336,-106]mm · 12 of 137 slices shown, 15 images]
[im 11/137  mediastinal]
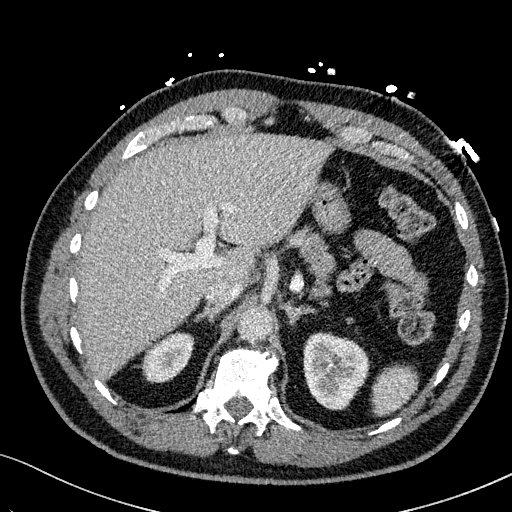
[im 11/137  lung]
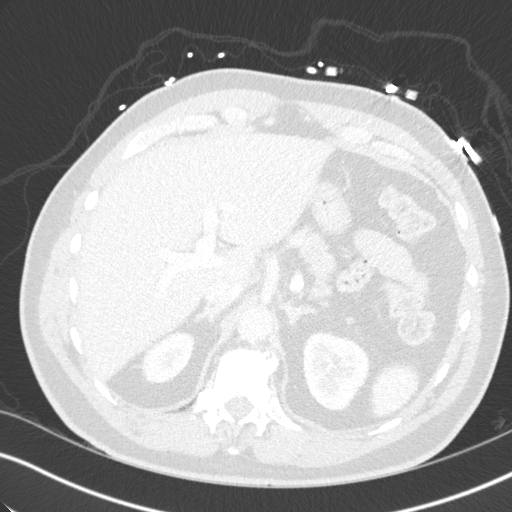
[im 21/137  lung]
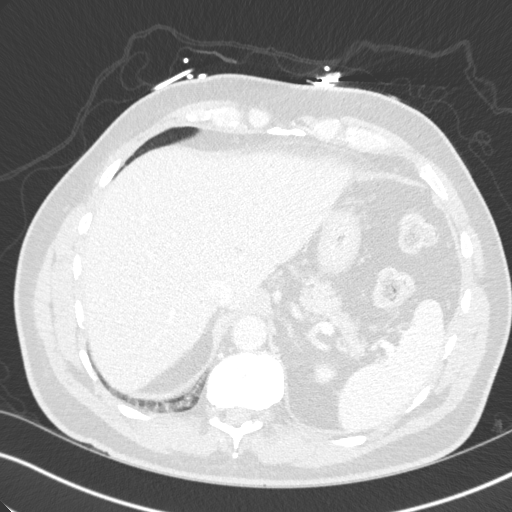
[im 31/137  lung]
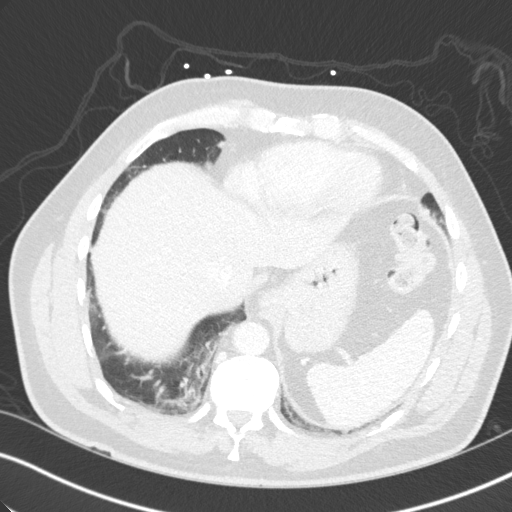
[im 41/137  lung]
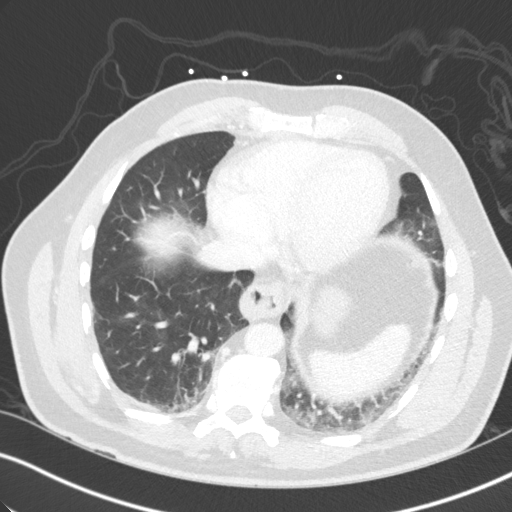
[im 51/137  mediastinal]
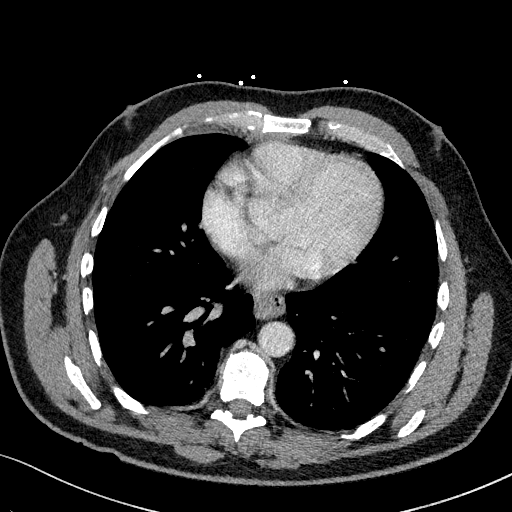
[im 51/137  lung]
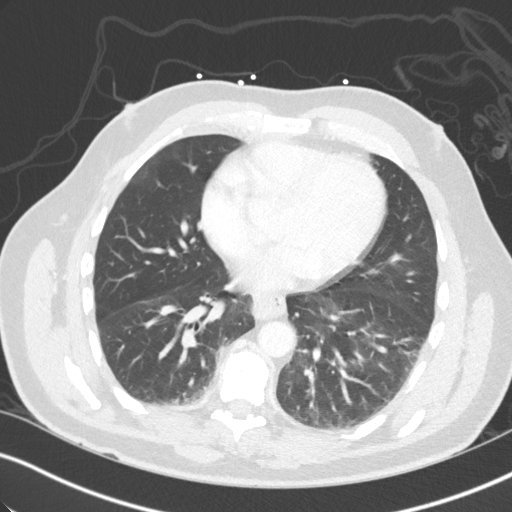
[im 61/137  lung]
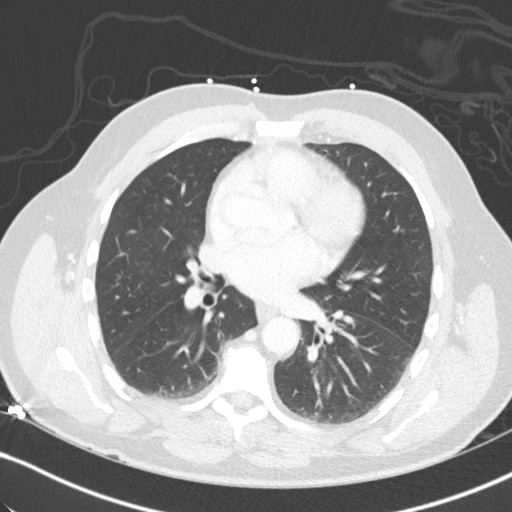
[im 76/137  lung]
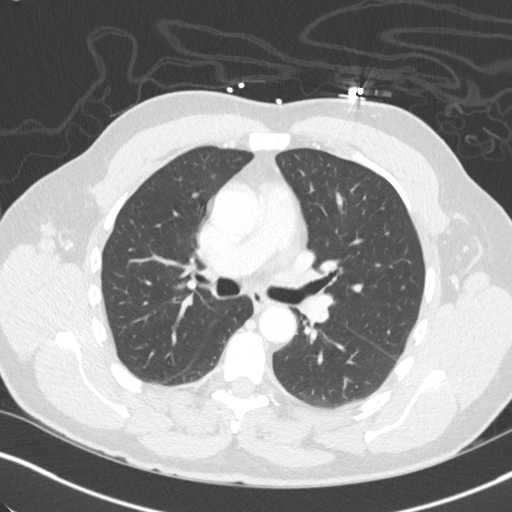
[im 86/137  lung]
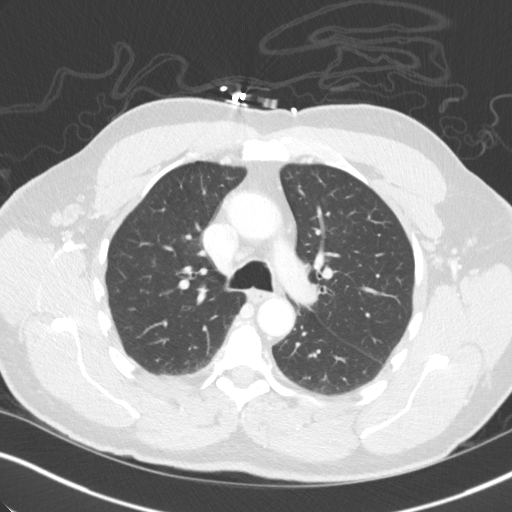
[im 96/137  mediastinal]
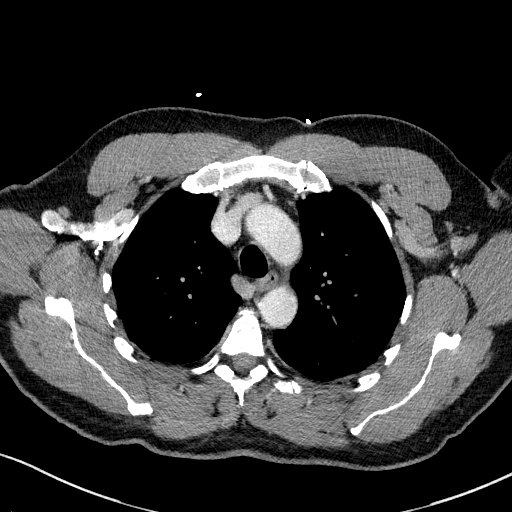
[im 96/137  lung]
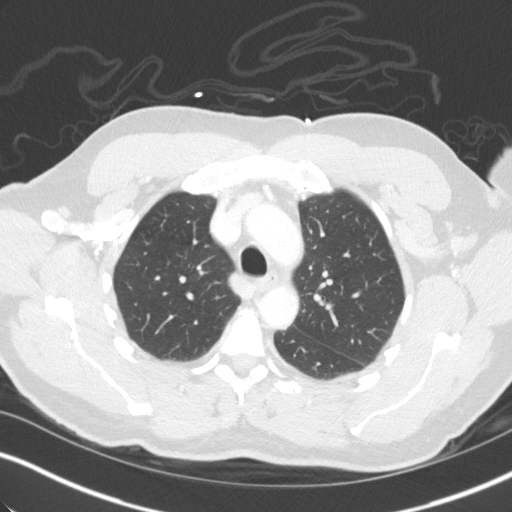
[im 106/137  lung]
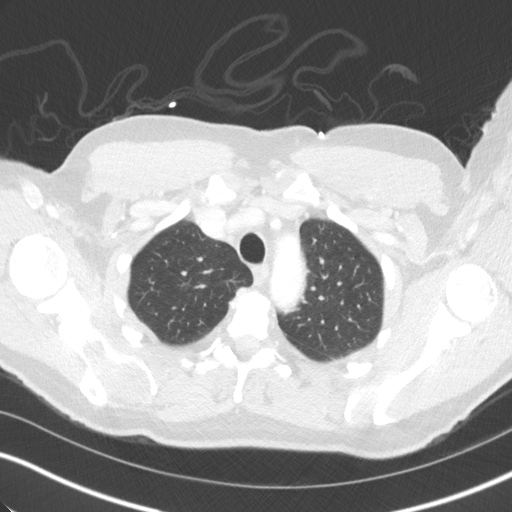
[im 116/137  lung]
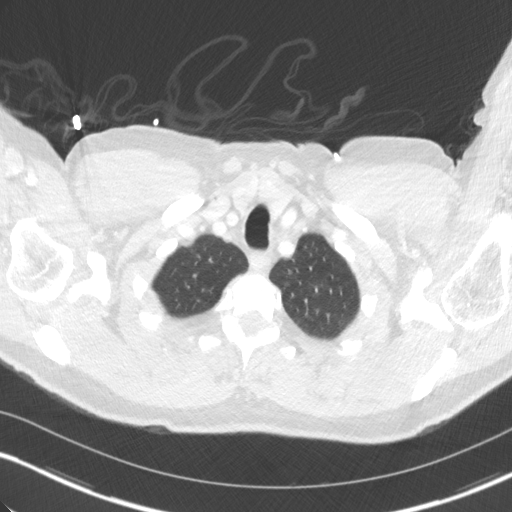
[im 126/137  lung]
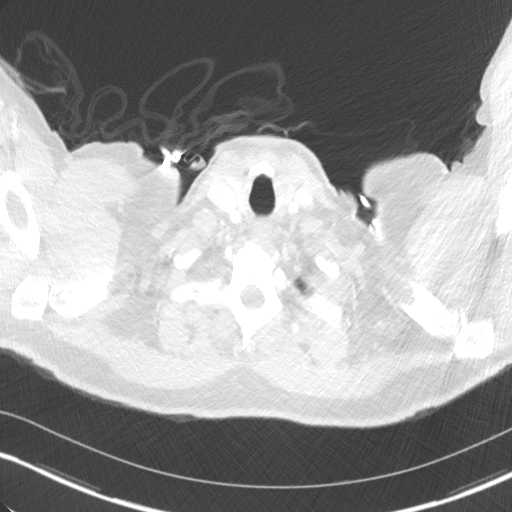

[Series 5: coronal · coronal · 0.54mm/px · 3 of 134 slices shown]
[im 27/134  lung]
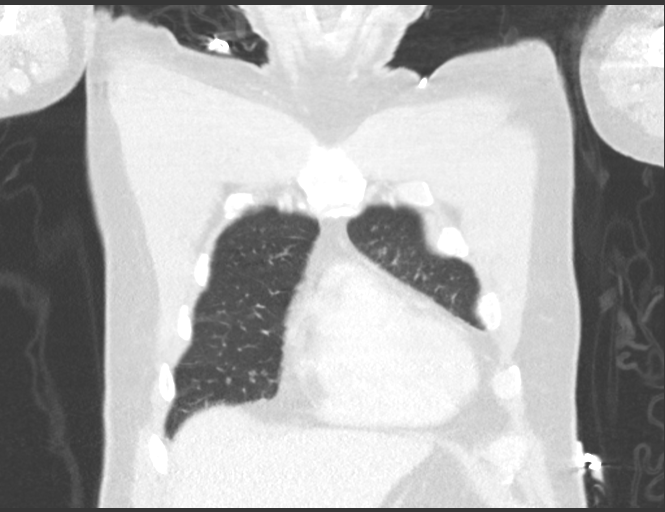
[im 54/134  lung]
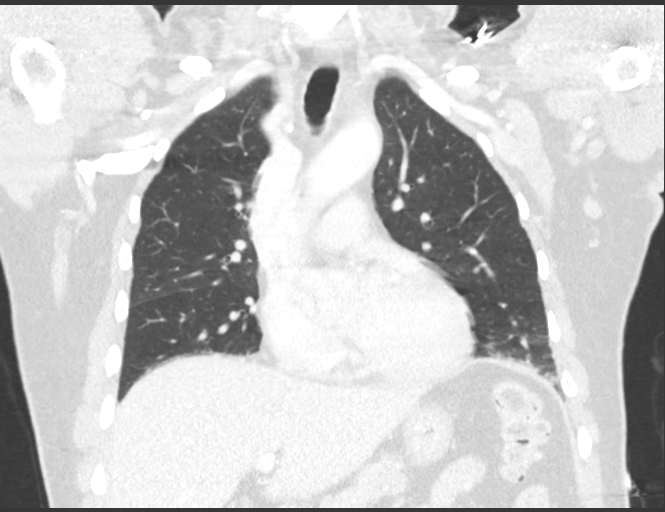
[im 80/134  lung]
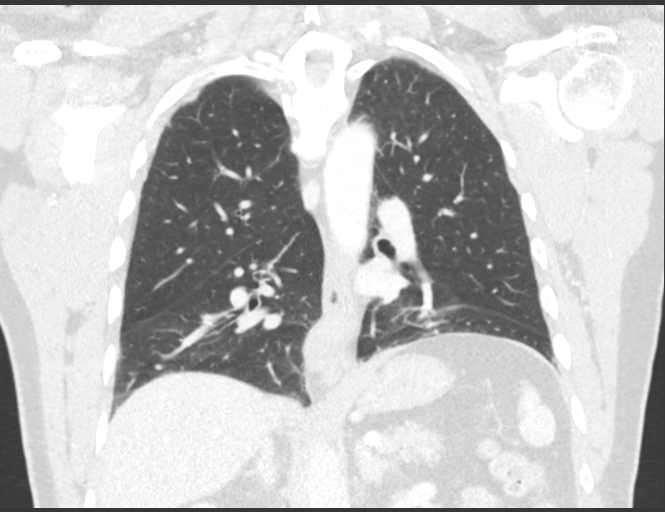

[15 of 36 positions shown; findings below may reference images not displayed]

FINDINGS: Cardiovascular: No evidence of thoracic aortic injury or mediastinal
hematoma. No pericardial effusion.

Mediastinum/Nodes: No masses or pathologically enlarged lymph nodes
identified.

Lungs/Pleura: No evidence of pulmonary contusion or other
infiltrate. Mild bibasilar atelectasis noted. No evidence of
pneumothorax or hemothorax.

Upper Abdomen:  Small hiatal hernia.

Musculoskeletal: No acute fractures or suspicious bone lesions
identified.
IMPRESSION: No acute traumatic injury identified.

Small hiatal hernia.

Mild bibasilar atelectasis.

## 2019-07-08 ENCOUNTER — Other Ambulatory Visit: Payer: Self-pay

## 2023-05-01 ENCOUNTER — Emergency Department (HOSPITAL_COMMUNITY)
Admission: EM | Admit: 2023-05-01 | Discharge: 2023-05-02 | Disposition: A | Discharge status: Home / Self Care | Payer: Medicare Other | Attending: Emergency Medicine | Admitting: Emergency Medicine

## 2023-05-01 DIAGNOSIS — J181 Lobar pneumonia, unspecified organism: Secondary | ICD-10-CM | POA: Insufficient documentation

## 2023-05-01 DIAGNOSIS — R1013 Epigastric pain: Secondary | ICD-10-CM | POA: Insufficient documentation

## 2023-05-01 DIAGNOSIS — R110 Nausea: Secondary | ICD-10-CM | POA: Insufficient documentation

## 2023-05-01 DIAGNOSIS — R1012 Left upper quadrant pain: Secondary | ICD-10-CM | POA: Insufficient documentation

## 2023-05-01 DIAGNOSIS — R748 Abnormal levels of other serum enzymes: Secondary | ICD-10-CM | POA: Diagnosis not present

## 2023-05-01 DIAGNOSIS — D72829 Elevated white blood cell count, unspecified: Secondary | ICD-10-CM | POA: Insufficient documentation

## 2023-05-01 DIAGNOSIS — Z7951 Long term (current) use of inhaled steroids: Secondary | ICD-10-CM | POA: Diagnosis not present

## 2023-05-01 DIAGNOSIS — J189 Pneumonia, unspecified organism: Secondary | ICD-10-CM

## 2023-05-01 DIAGNOSIS — E871 Hypo-osmolality and hyponatremia: Secondary | ICD-10-CM | POA: Diagnosis not present

## 2023-05-01 DIAGNOSIS — R140 Abdominal distension (gaseous): Secondary | ICD-10-CM | POA: Insufficient documentation

## 2023-05-01 NOTE — ED Triage Notes (Signed)
Pt c/o abdominal pain and nausea x 6 days. Was seen at [facility] on Friday and given Zofran with no relief. Pt states pain in LUQ has been constant.

## 2023-05-02 ENCOUNTER — Emergency Department (HOSPITAL_COMMUNITY): Payer: Medicare Other

## 2023-05-02 ENCOUNTER — Other Ambulatory Visit: Payer: Self-pay

## 2023-05-02 ENCOUNTER — Emergency Department (HOSPITAL_COMMUNITY)
Admission: EM | Admit: 2023-05-02 | Discharge: 2023-05-02 | Disposition: A | Discharge status: Home / Self Care | Payer: Medicare Other | Source: Home / Self Care | Attending: Emergency Medicine | Admitting: Emergency Medicine

## 2023-05-02 DIAGNOSIS — J181 Lobar pneumonia, unspecified organism: Secondary | ICD-10-CM | POA: Insufficient documentation

## 2023-05-02 DIAGNOSIS — R1013 Epigastric pain: Secondary | ICD-10-CM | POA: Insufficient documentation

## 2023-05-02 DIAGNOSIS — D72829 Elevated white blood cell count, unspecified: Secondary | ICD-10-CM | POA: Insufficient documentation

## 2023-05-02 DIAGNOSIS — E871 Hypo-osmolality and hyponatremia: Secondary | ICD-10-CM | POA: Insufficient documentation

## 2023-05-02 DIAGNOSIS — R748 Abnormal levels of other serum enzymes: Secondary | ICD-10-CM | POA: Insufficient documentation

## 2023-05-02 DIAGNOSIS — J189 Pneumonia, unspecified organism: Secondary | ICD-10-CM

## 2023-05-02 DIAGNOSIS — R110 Nausea: Secondary | ICD-10-CM | POA: Diagnosis not present

## 2023-05-02 LAB — URINALYSIS, ROUTINE W REFLEX MICROSCOPIC
Bilirubin Urine: NEGATIVE
Bilirubin Urine: NEGATIVE
Glucose, UA: NEGATIVE mg/dL
Glucose, UA: NEGATIVE mg/dL
Hgb urine dipstick: NEGATIVE
Hgb urine dipstick: NEGATIVE
Ketones, ur: NEGATIVE mg/dL
Ketones, ur: NEGATIVE mg/dL
Leukocytes,Ua: NEGATIVE
Leukocytes,Ua: NEGATIVE
Nitrite: NEGATIVE
Nitrite: NEGATIVE
Protein, ur: NEGATIVE mg/dL
Protein, ur: NEGATIVE mg/dL
Specific Gravity, Urine: 1.008 (ref 1.005–1.030)
Specific Gravity, Urine: 1.004 — ABNORMAL LOW (ref 1.005–1.030)
pH: 7 (ref 5.0–8.0)
pH: 6 (ref 5.0–8.0)

## 2023-05-02 LAB — COMPREHENSIVE METABOLIC PANEL
ALT: 67 U/L — ABNORMAL HIGH (ref 0–44)
ALT: 77 U/L — ABNORMAL HIGH (ref 0–44)
AST: 52 U/L — ABNORMAL HIGH (ref 15–41)
AST: 65 U/L — ABNORMAL HIGH (ref 15–41)
Albumin: 3.2 g/dL — ABNORMAL LOW (ref 3.5–5.0)
Albumin: 3.1 g/dL — ABNORMAL LOW (ref 3.5–5.0)
Alkaline Phosphatase: 113 U/L (ref 38–126)
Alkaline Phosphatase: 132 U/L — ABNORMAL HIGH (ref 38–126)
Anion gap: 14 (ref 5–15)
Anion gap: 10 (ref 5–15)
BUN: 12 mg/dL (ref 8–23)
BUN: 10 mg/dL (ref 8–23)
CO2: 21 mmol/L — ABNORMAL LOW (ref 22–32)
CO2: 25 mmol/L (ref 22–32)
Calcium: 8.7 mg/dL — ABNORMAL LOW (ref 8.9–10.3)
Calcium: 9.1 mg/dL (ref 8.9–10.3)
Chloride: 95 mmol/L — ABNORMAL LOW (ref 98–111)
Chloride: 99 mmol/L (ref 98–111)
Creatinine, Ser: 1.01 mg/dL (ref 0.61–1.24)
Creatinine, Ser: 1.02 mg/dL (ref 0.61–1.24)
GFR, Estimated: >60 mL/min (ref >60)
GFR, Estimated: >60 mL/min (ref >60)
Glucose, Bld: 116 mg/dL — ABNORMAL HIGH (ref 70–99)
Glucose, Bld: 107 mg/dL — ABNORMAL HIGH (ref 70–99)
Potassium: 4.1 mmol/L (ref 3.5–5.1)
Potassium: 4 mmol/L (ref 3.5–5.1)
Sodium: 130 mmol/L — ABNORMAL LOW (ref 135–145)
Sodium: 134 mmol/L — ABNORMAL LOW (ref 135–145)
Total Bilirubin: 0.7 mg/dL (ref 0.3–1.2)
Total Bilirubin: 0.7 mg/dL (ref 0.3–1.2)
Total Protein: 7.5 g/dL (ref 6.5–8.1)
Total Protein: 7.7 g/dL (ref 6.5–8.1)

## 2023-05-02 LAB — CBC
HCT: 37 % — ABNORMAL LOW (ref 39.0–52.0)
HCT: 39.8 % (ref 39.0–52.0)
Hemoglobin: 12.8 g/dL — ABNORMAL LOW (ref 13.0–17.0)
Hemoglobin: 13.6 g/dL (ref 13.0–17.0)
MCH: 33 pg (ref 26.0–34.0)
MCH: 33.4 pg (ref 26.0–34.0)
MCHC: 34.2 g/dL (ref 30.0–36.0)
MCHC: 34.6 g/dL (ref 30.0–36.0)
MCV: 95.4 fL (ref 80.0–100.0)
MCV: 97.8 fL (ref 80.0–100.0)
Platelets: 245 10*3/uL (ref 150–400)
Platelets: 262 10*3/uL (ref 150–400)
RBC: 3.88 MIL/uL — ABNORMAL LOW (ref 4.22–5.81)
RBC: 4.07 MIL/uL — ABNORMAL LOW (ref 4.22–5.81)
RDW: 12.6 % (ref 11.5–15.5)
RDW: 12.7 % (ref 11.5–15.5)
WBC: 13.9 10*3/uL — ABNORMAL HIGH (ref 4.0–10.5)
WBC: 14.2 10*3/uL — ABNORMAL HIGH (ref 4.0–10.5)
nRBC: 0 % (ref 0.0–0.2)
nRBC: 0 % (ref 0.0–0.2)

## 2023-05-02 LAB — LACTIC ACID, PLASMA: Lactic Acid, Venous: 0.9 mmol/L (ref 0.5–1.9)

## 2023-05-02 LAB — LIPASE, BLOOD
Lipase: 26 U/L (ref 11–51)
Lipase: 26 U/L (ref 11–51)

## 2023-05-02 MED ORDER — SUCRALFATE 1 GM/10ML PO SUSP
1.0000 g | Freq: Once | ORAL | Status: AC
  Administered 2023-05-02: 1 g via ORAL
  Filled 2023-05-02: qty 10

## 2023-05-02 MED ORDER — ONDANSETRON HCL 4 MG/2ML IJ SOLN
4.0000 mg | Freq: Once | INTRAMUSCULAR | Status: AC
  Administered 2023-05-02: 4 mg via INTRAVENOUS
  Filled 2023-05-02: qty 2

## 2023-05-02 MED ORDER — IOHEXOL 300 MG/ML  SOLN
100.0000 mL | Freq: Once | INTRAMUSCULAR | Status: AC | PRN
  Administered 2023-05-02: 100 mL via INTRAVENOUS

## 2023-05-02 MED ORDER — FAMOTIDINE IN NACL 20-0.9 MG/50ML-% IV SOLN: 20.0000 mg | INTRAVENOUS | Status: DC

## 2023-05-02 MED ORDER — PROCHLORPERAZINE MALEATE 10 MG PO TABS: 10.0000 mg | ORAL_TABLET | Freq: Two times a day (BID) | ORAL | 0 refills | Status: DC | PRN

## 2023-05-02 MED ORDER — FAMOTIDINE 20 MG PO TABS
20.0000 mg | ORAL_TABLET | Freq: Once | ORAL | Status: AC
  Administered 2023-05-02: 20 mg via ORAL
  Filled 2023-05-02: qty 1

## 2023-05-02 MED ORDER — SODIUM CHLORIDE 0.9 % IV BOLUS
1000.0000 mL | Freq: Once | INTRAVENOUS | Status: AC
  Administered 2023-05-02: 1000 mL via INTRAVENOUS

## 2023-05-02 MED ORDER — AMOXICILLIN-POT CLAVULANATE 875-125 MG PO TABS: 1.0000 | ORAL_TABLET | Freq: Two times a day (BID) | ORAL | 0 refills | Status: DC

## 2023-05-02 MED ORDER — AMOXICILLIN-POT CLAVULANATE 875-125 MG PO TABS
1.0000 | ORAL_TABLET | Freq: Once | ORAL | Status: AC
  Administered 2023-05-02: 1 via ORAL
  Filled 2023-05-02: qty 1

## 2023-05-02 NOTE — ED Triage Notes (Signed)
Pt evaluated at [facility] yesterday for abd pain and nausea x 6 days; was told to follow up with GI and given antibiotic of poss pneumonia; pt states they called GI and they won't answer the phone; pt continues to have  nausea and stabbing pain in R shoulder blade

## 2023-05-02 NOTE — ED Provider Notes (Signed)
[place] EMERGENCY DEPARTMENT AT [facility]  Provider Note  CSN: [id number] Arrival date & time: 05/01/23 2334  History Chief Complaint  Patient presents with   Nausea    Donald Melendez is a 72 y.o. male reports about 5-6 days of persistent nausea. Had some LUQ abdominal pain last week but that has resolved. Was vomiting 4 days ago, went to UC and was given Zofran and Phenergan which did not help while he was there but he has not vomited since then. He had one loose stool last week then minimal stool output since then. He feels bloated and like he can't get a deep breath. Denies fever. Had remote appendectomy in the 1970s, no other abdominal surgeries.    Home Medications Prior to Admission medications   Medication Sig Start Date End Date Taking? Authorizing Provider  amoxicillin-clavulanate (AUGMENTIN) 875-125 MG tablet Take 1 tablet by mouth every 12 (twelve) hours. 05/02/23  Yes Pollyann Savoy, MD  prochlorperazine (COMPAZINE) 10 MG tablet Take 1 tablet (10 mg total) by mouth 2 (two) times daily as needed for nausea or vomiting. 05/02/23  Yes Pollyann Savoy, MD  albuterol (PROVENTIL HFA;VENTOLIN HFA) 108 (90 Base) MCG/ACT inhaler Inhale 2 puffs into the lungs every 6 (six) hours as needed for wheezing or shortness of breath. 09/11/17   Dixon, Patriciaann Clan, PA-C  Ibuprofen-Diphenhydramine Cit (IBUPROFEN PM PO) Take 1 tablet by mouth at bedtime as needed (sleep).    [provider]  Multiple Vitamin (MULTIVITAMIN) tablet Take 1 tablet by mouth daily.      [provider]  Omega-3 Fatty Acids (FISH OIL) 1000 MG CAPS Take 1 capsule by mouth daily.    [provider]  vitamin C (ASCORBIC ACID) 500 MG tablet Take 500 mg by mouth daily.    [provider]     Allergies    Patient has no known allergies.   Review of Systems   Review of Systems Please see HPI for pertinent positives and negatives  Physical Exam BP (!) 146/89    Pulse 86   Temp 98.5 F (36.9 C)   Resp 13   Ht 5\' 8"  (1.727 m)   Wt 88.5 kg   SpO2 93%   BMI 29.65 kg/m   Physical Exam Vitals and nursing note reviewed.  Constitutional:      Appearance: Normal appearance.  HENT:     Head: Normocephalic and atraumatic.     Nose: Nose normal.     Mouth/Throat:     Mouth: Mucous membranes are dry.  Eyes:     Extraocular Movements: Extraocular movements intact.     Conjunctiva/sclera: Conjunctivae normal.  Cardiovascular:     Rate and Rhythm: Normal rate.  Pulmonary:     Effort: Pulmonary effort is normal.     Breath sounds: Normal breath sounds.  Abdominal:     General: Bowel sounds are normal. There is distension.     Palpations: Abdomen is soft.     Tenderness: There is no abdominal tenderness. There is no guarding.  Musculoskeletal:        General: No swelling. Normal range of motion.     Cervical back: Neck supple.  Skin:    General: Skin is warm and dry.  Neurological:     General: No focal deficit present.     Mental Status: He is alert.  Psychiatric:        Mood and Affect: Mood normal.     ED Results /  Procedures / Treatments   EKG None  Procedures Procedures  Medications Ordered in the ED Medications  amoxicillin-clavulanate (AUGMENTIN) 875-125 MG per tablet 1 tablet (has no administration in time range)  sodium chloride 0.9 % bolus 1,000 mL (1,000 mLs Intravenous New Bag/Given 05/02/23 0059)  ondansetron (ZOFRAN) injection 4 mg (4 mg Intravenous Given 05/02/23 0057)  iohexol (OMNIPAQUE) 300 MG/ML solution 100 mL (100 mLs Intravenous Contrast Given 05/02/23 0132)    Initial Impression and Plan  Patient here with persist nausea for almost a week. Had some pain and vomiting several days ago but none since. Has some mild distention but not tender to palpation. Will send labs and check CT. Zofran for symptom relief.   ED Course   Clinical Course as of 05/02/23 0238  Tue May 02, 2023  0101 CBC with leukocytosis and  mild anemia, no old to compare.  [CS]  0125 CMP with mild hyponatremia and borderline elevated LFTs. Lipase and Lactic acid are normal.  [CS]  0154 I personally viewed the images from radiology studies and agree with radiologist interpretation: CT neg for acute process in abdomen. Inflammatory vs infection nodules in RLL lung of unclear significance. Does not seem to correlate with his symptoms [CS]  0158 UA is negative.  [CS]  0235 Patient reports some recent phlegm. Given CT findings in chest and leukocytosis. Will treat with Abx for possible CAP. Rx for compazine for nausea. He does not have PCP. Advised GI follow up if his symptoms persist. RTED for any other concerns [CS]    Clinical Course User Index [CS] Pollyann Savoy, MD     MDM Rules/Calculators/A&P Medical Decision Making Given presenting complaint, I considered that admission might be necessary. After review of results from ED lab and/or imaging studies, admission to the hospital is not indicated at this time.    Problems Addressed: Community acquired pneumonia of right lower lobe of lung: acute illness or injury Nausea: acute illness or injury  Amount and/or Complexity of Data Reviewed Labs: ordered. Decision-making details documented in ED Course. Radiology: ordered and independent interpretation performed. Decision-making details documented in ED Course.  Risk Prescription drug management. Decision regarding hospitalization.     Final Clinical Impression(s) / ED Diagnoses Final diagnoses:  Nausea  Community acquired pneumonia of right lower lobe of lung    Rx / DC Orders ED Discharge Orders          Ordered    amoxicillin-clavulanate (AUGMENTIN) 875-125 MG tablet  Every 12 hours        05/02/23 0237    prochlorperazine (COMPAZINE) 10 MG tablet  2 times daily PRN        05/02/23 0237             Pollyann Savoy, MD 05/02/23 [phone]

## 2023-05-02 NOTE — Discharge Instructions (Addendum)
It was a pleasure caring for you today. Ultrasound showed steatosis of liver - I am ordering Hepatitis panel which should result by the time of your GI appointment this thursday.  Chest xray showing pneumonia - it is important that you continue taking your antibiotics which should help with your nausea over time. All other lab work was reassuring.  Seek emergency care if experiencing any new or worsening symptoms.

## 2023-05-02 NOTE — ED Provider Notes (Signed)
[place] [facility] DE[facility]MENT AT [facility] Provider Note   CSN: [id number] Arrival date & time: 05/02/23  1101     History {Add pertinent medical, surgical, social history, OB history to HPI:1} No chief complaint on file.   Donald Melendez is a 72 y.o. male who presents to ED complaining of abdominal pain and nausea. Symptoms started with diarrhea, nausea, and vomiting that started 6 days ago. Patient went to urgent care and was discharged with zofran - patient has not had diarrhea or vomiting since. Patient presented to ED yesterday and had CT obtained showing PNA and no acute abdominal/pelvic processes - discharged with ABX for PNA. Patient tried to call GI today, but they did not answer phone so patient and wife came to ED today. Patient now complaining of epigastric abdominal "discomfort" and nausea and states that Zofran and Phenergan did not help this past week. Patient also admits that he feels bloated.  Denies fever, chest pain, hematemesis, dysuria. Patient with past history of appendectomy.  HPI     Home Medications Prior to Admission medications   Medication Sig Start Date End Date Taking? Authorizing Provider  albuterol (PROVENTIL HFA;VENTOLIN HFA) 108 (90 Base) MCG/ACT inhaler Inhale 2 puffs into the lungs every 6 (six) hours as needed for wheezing or shortness of breath. 09/11/17   Allayne Butcher B, PA-C  amoxicillin-clavulanate (AUGMENTIN) 875-125 MG tablet Take 1 tablet by mouth every 12 (twelve) hours. 05/02/23   Pollyann Savoy, MD  Ibuprofen-Diphenhydramine Cit (IBUPROFEN PM PO) Take 1 tablet by mouth at bedtime as needed (sleep).    [provider]  Multiple Vitamin (MULTIVITAMIN) tablet Take 1 tablet by mouth daily.      [provider]  Omega-3 Fatty Acids (FISH OIL) 1000 MG CAPS Take 1 capsule by mouth daily.    [provider]  prochlorperazine (COMPAZINE) 10 MG tablet Take 1 tablet (10 mg total) by mouth 2 (two) times  daily as needed for nausea or vomiting. 05/02/23   Pollyann Savoy, MD  vitamin C (ASCORBIC ACID) 500 MG tablet Take 500 mg by mouth daily.    [provider]      Allergies    Patient has no known allergies.    Review of Systems   Review of Systems  Gastrointestinal:  Positive for abdominal pain and vomiting.    Physical Exam Updated Vital Signs BP 124/75   Pulse 83   Temp 98.4 F (36.9 C)   Resp 19   SpO2 99%  Physical Exam Vitals and nursing note reviewed.  Constitutional:      General: He is not in acute distress. HENT:     Head: Normocephalic and atraumatic.     Mouth/Throat:     Mouth: Mucous membranes are moist.     Pharynx: No oropharyngeal exudate or posterior oropharyngeal erythema.  Eyes:     General: No scleral icterus.       Right eye: No discharge.        Left eye: No discharge.     Conjunctiva/sclera: Conjunctivae normal.  Cardiovascular:     Rate and Rhythm: Normal rate.     Pulses: Normal pulses.     Heart sounds: Normal heart sounds. No murmur heard. Pulmonary:     Effort: Pulmonary effort is normal. No respiratory distress.     Breath sounds: No wheezing, rhonchi or rales.  Abdominal:     Tenderness: There is no abdominal tenderness.  Musculoskeletal:     Right  lower leg: No edema.     Left lower leg: No edema.     Comments: +2 pedal and radial pulses. No calf tenderness or lower extremity edema.  Skin:    General: Skin is warm and dry.     Findings: No rash.  Neurological:     General: No focal deficit present.     Mental Status: He is alert. Mental status is at baseline.     Comments: GCS 15. Speech is goal oriented. No deficits appreciated to CN III-XII; symmetric eyebrow raise, no facial drooping, tongue midline. Patient has equal grip strength bilaterally with 5/5 strength against resistance in all major muscle groups bilaterally. Sensation to light touch intact. Patient moves extremities without ataxia. Patient ambulatory with  steady gait.   Psychiatric:        Mood and Affect: Mood normal.     ED Results / Procedures / Treatments   Labs (all labs ordered are listed, but only abnormal results are displayed) Labs Reviewed  COMPREHENSIVE METABOLIC PANEL - Abnormal; Notable for the following components:      Result Value   Sodium 134 (*)    Glucose, Bld 107 (*)    Albumin 3.1 (*)    AST 65 (*)    ALT 77 (*)    Alkaline Phosphatase 132 (*)    All other components within normal limits  CBC - Abnormal; Notable for the following components:   WBC 13.9 (*)    RBC 4.07 (*)    All other components within normal limits  URINALYSIS, ROUTINE W REFLEX MICROSCOPIC - Abnormal; Notable for the following components:   Color, Urine STRAW (*)    Specific Gravity, Urine 1.004 (*)    All other components within normal limits  LIPASE, BLOOD  HEPATITIS PANEL, ACUTE    EKG None  Radiology US Abdomen Limited RUQ (LIVER/GB)  Result Date: 05/02/2023 CLINICAL DATA:  Abdominal pain and nausea EXAM: ULTRASOUND ABDOMEN LIMITED RIGHT UPPER QUADRANT COMPARISON:  CT abdomen pelvis with contrast 05/02/2023 FINDINGS: Gallbladder: Gallstones: None Sludge: None Gallbladder Wall: Within normal limits Pericholecystic fluid: None Sonographic Murphy's Sign: Negative per technologist Common bile duct: Diameter: 5 mm Liver: Parenchymal echogenicity: Diffusely increased Contours: Normal Lesions: None Portal vein: Patent.  Hepatopetal flow Other: None. IMPRESSION: Diffuse increased echogenicity of the hepatic parenchyma is a nonspecific indicator of hepatocellular dysfunction, most commonly steatosis. Electronically Signed   By: Acquanetta Belling M.D.   On: 05/02/2023 13:58   DG Chest 2 View  Result Date: 05/02/2023 CLINICAL DATA:  Orthopnea. Six days of nausea with headaches and decreased oral intake. Possible pneumonia. EXAM: CHEST - 2 VIEW COMPARISON:  Chest CT and radiographs 10/09/2016. Abdominal CT 05/02/2023. FINDINGS: 1248 hours. There is  patchy airspace disease in the right upper lobe, suspicious for pneumonia. The clustered nodularity in the right lower lobe seen on earlier abdominal CT is not well visualized radiographically. The left lung appears clear. There is no pleural effusion or pneumothorax. The heart size and mediastinal contours are normal. There are degenerative changes in the spine without acute osseous abnormality. Telemetry leads overlie the chest. IMPRESSION: Right upper lobe airspace disease suspicious for pneumonia. Based on earlier abdominal CT, this may reflect multilobar pneumonia. Followup PA and lateral chest X-ray is recommended in 3-4 weeks following appropriate therapy to ensure resolution and exclude underlying malignancy. Electronically Signed   By: Carey Bullocks M.D.   On: 05/02/2023 13:13   CT ABDOMEN PELVIS W CONTRAST  Result Date: 05/02/2023 CLINICAL DATA:  Bowel obstruction suspected Abdominal pain, acute, nonlocalized EXAM: CT ABDOMEN AND PELVIS WITH CONTRAST TECHNIQUE: Multidetector CT imaging of the abdomen and pelvis was performed using the standard protocol following bolus administration of intravenous contrast. RADIATION DOSE REDUCTION: This exam was performed according to the departmental dose-optimization program which includes automated exposure control, adjustment of the mA and/or kV according to patient size and/or use of iterative reconstruction technique. CONTRAST:  OMNIPAQUE IOHEXOL 300 MG/ML  SOLN COMPARISON:  None Available. FINDINGS: Lower chest: Patchy nodular opacities clustered in the posteromedial right lower lobe, likely infectious or inflammatory. Left lung base clear. No effusions. Hepatobiliary: No focal hepatic abnormality. Gallbladder unremarkable. Pancreas: No focal abnormality or ductal dilatation. Spleen: No focal abnormality.  Normal size. Adrenals/Urinary Tract: No suspicious renal or adrenal lesion. No hydronephrosis. Urinary bladder unremarkable. Stomach/Bowel: Stomach,  large and small bowel grossly unremarkable. Vascular/Lymphatic: No evidence of aneurysm or adenopathy. Reproductive: Mildly prominent prostate Other: No free fluid or free air. Musculoskeletal: No acute bony abnormality. IMPRESSION: Clustered nodular densities and airspace opacities in the posteromedial right lower lobe. These are most likely infectious or inflammatory. No acute findings in the abdomen or pelvis. Electronically Signed   By: Charlett Nose M.D.   On: 05/02/2023 01:46    Procedures Procedures  {Document cardiac monitor, telemetry assessment procedure when appropriate:1}  Medications Ordered in ED Medications  sucralfate (CARAFATE) 1 GM/10ML suspension 1 g (1 g Oral Given 05/02/23 1403)  famotidine (PEPCID) tablet 20 mg (20 mg Oral Given 05/02/23 1402)    ED Course/ Medical Decision Making/ A&P   {   Click here for ABCD2, HEART and other calculatorsREFRESH Note before signing :1}                          Medical Decision Making Amount and/or Complexity of Data Reviewed Labs: ordered. Radiology: ordered.    This patient presents to the ED for concern of abdominal pain, this involves an extensive number of treatment options, and is a complaint that carries with it a high risk of complications and morbidity.  The differential diagnosis includes gastroenteritis, colitis, small bowel obstruction, appendicitis, cholecystitis, pancreatitis, nephrolithiasis, UTI, pyleonephritis   Co morbidities that complicate the patient evaluation  none    Lab Tests:  I Ordered, and personally interpreted labs.  The pertinent results include:  *** CBC with differential: mild leukocytosis; no concern for anemia CMP: mild hyponatremia (134); elevated liver enzymes Lipase: within normal limits UA: no concern for UTI    Imaging Studies ordered:  I ordered imaging studies including ***  -CT Abd/Pelvis with contrast: obtained yesterday - showed no acute concern that would cause patient's  symptoms -[place] RUQ: no  I independently visualized and interpreted imaging I agree with the radiologist interpretation   Cardiac Monitoring: / EKG:  The patient was maintained on a cardiac monitor.  I personally viewed and interpreted the cardiac monitored which showed an underlying rhythm of: sinus rhythm without acute ST changes or arrhythmias   Consultations Obtained:  I requested consultation with the ***,  and discussed lab and imaging findings as well as pertinent plan - they recommend: ***   Problem List / ED Course / Critical interventions / Medication management  Patient presented for abdominal pain. On exam patient was *** I have reviewed the patients home medicines and have made adjustments as needed Patient was given return precautions. Patient stable for discharge at this time.  Patient verbalized understanding of plan.  Ddx:  these are considered less likely due to history of present illness and physical exam -gastroenteritis -colitis -small bowel obstruction -appendicitis -cholecystitis -pancreatitis -nephrolithiasis -UTI -pyleonephritis     Social Determinants of Health:  none   Test / Admission - Considered:  ***   {Document critical care time when appropriate:1} {Document review of labs and clinical decision tools ie heart score, Chads2Vasc2 etc:1}  {Document your independent review of radiology images, and any outside records:1} {Document your discussion with family members, caretakers, and with consultants:1} {Document social determinants of health affecting pt's care:1} {Document your decision making why or why not admission, treatments were needed:1} Final Clinical Impression(s) / ED Diagnoses Final diagnoses:  None    Rx / DC Orders ED Discharge Orders     None

## 2023-05-04 ENCOUNTER — Encounter: Payer: Self-pay | Admitting: Gastroenterology

## 2023-05-04 ENCOUNTER — Ambulatory Visit (INDEPENDENT_AMBULATORY_CARE_PROVIDER_SITE_OTHER): Payer: Medicare Other | Admitting: Gastroenterology

## 2023-05-04 VITALS — BP 134/78 | HR 76 | Temp 98.1°F | Ht 67.0 in | Wt 184.6 lb

## 2023-05-04 DIAGNOSIS — R1013 Epigastric pain: Secondary | ICD-10-CM

## 2023-05-04 DIAGNOSIS — R110 Nausea: Secondary | ICD-10-CM

## 2023-05-04 MED ORDER — PANTOPRAZOLE SODIUM 40 MG PO TBEC: 40.0000 mg | DELAYED_RELEASE_TABLET | Freq: Every day | ORAL | 2 refills | Status: DC

## 2023-05-04 NOTE — Progress Notes (Signed)
GI Office Note    Referring Provider: No ref. provider found Primary Care Physician:  Pcp, No  Primary Gastroenterologist: Hennie Duos. Marletta Lor, DO  Chief Complaint   Chief Complaint  Patient presents with   Abdominal Pain    Abdominal pain and nausea been going on for a week and a half been to two different hospitals and an urgent care.   History of Present Illness   Donald Melendez is a 72 y.o. male presenting today at the request of No ref. provider found for nausea and abdominal pain.  ED visit at [facility] 05/01/2023 for 5 to 6 days of persistent nausea.  Also was having left upper quadrant pain that had resolved.  Last vomiting episode 4 days prior.  Went to urgent care and was given Zofran and Phenergan which she reported did not help.  Reported 1 loose stool previously and minimal stool output since then.  Also feeling bloated like he cannot get a deep breath.  Reported remote appendectomy in the 1970s.  Had evidence of leukocytosis and mild anemia on labs.  Also with mild hyponatremia and borderline elevated LFTs.  Lipase normal.  CT A/P with clustered nodular densities and airspace opacities in the posterior.  Right lower lobe, infectious versus inflammatory.  No acute findings in the abdomen or pelvis.  No focal liver abnormality.  Gallbladder unremarkable.  UA negative.  Given antibiotics to treat for community-acquired pneumonia.  Gave Compazine for nausea.  Advise GI follow-up if symptoms persisted.  Represented to [facility] ED on 5/28 for abdominal discomfort and nausea.  Stated he tried to call GI but did not answer the phone so the patient and his wife came to the ED.  Again complaining of epigastric abdominal discomfort and nausea and not having any improvement with Zofran or Phenergan for the past week.  Also feeling bloated and having small bowel movements with inability to eat much.  Lipase within normal limits.  Mild hyponatremia and elevated liver enzymes on CMP.  RUQ [place]  with diffuse increased echogenicity of the liver most commonly consistent with steatosis.  Advised to follow-up with a chest x-ray in 3-4 weeks.  Provided with GI cocktail which did not relieve his symptoms.  EDP felt as though nausea was exacerbated by his pneumonia and that it may resolve over time with antibiotic treatment.  Patient declined offer of Zofran and Phenergan.  Patient stating that he is using ice packs on his abdomen to provide relief.  Labs 05/02/23: Sodium 134, glucose 107, albumin 3.1, AST 65, ALT 77, alk phos 132.  Hemoglobin 13.6, WBC 13.9.  Today: Has had nausea and abdominal pain since last Tuesday. After exercising that night he starting feeling like he could have the flu. The first day he had LUQ pain and then also having nausea. Wife states he was throwing up/dry heaves in the urgent care office. States at the urgent care office they gave him a shot in his bottom and zofran and then sent him home without any answers.   This is affecting his sleeping as well.   Does not feel like eating. Has a headache in the afternoons and unable to look at bright lights. Nausea is still there and does not want to really eat much. Had a half PBJ yesterday. Does not throw up right away after eating. Has been taking the Augmentin.Has had some diarrhea since being on the Augmentin. Had one episode of diarrhea last week due to urgency.   Has  been working on trying to get a primary care provider because he does not have one. Can't not get in until July and August.   Has not really taken compazine.   Prior to all these symptoms occurring he would take ibuprofen once daily. Has been taking it less frequently since all of this started.   Before all of this she was regular but has had some mild constipation since all oft his started and did take a stool softener and then that helped him go. He does note a history of reflux in the past. Tries to not eat late at night anymore and trying to avoid a lot  of that. Does have some intermittent dysphagia but is very rare.   No chest pain or shortness of breath. No melena or brbpr.    Current Outpatient Medications  Medication Sig Dispense Refill   albuterol (PROVENTIL HFA;VENTOLIN HFA) 108 (90 Base) MCG/ACT inhaler Inhale 2 puffs into the lungs every 6 (six) hours as needed for wheezing or shortness of breath. 1 Inhaler 2   amoxicillin-clavulanate (AUGMENTIN) 875-125 MG tablet Take 1 tablet by mouth every 12 (twelve) hours. 14 tablet 0   Ibuprofen-Diphenhydramine Cit (IBUPROFEN PM PO) Take 1 tablet by mouth at bedtime as needed (sleep).     Multiple Vitamin (MULTIVITAMIN) tablet Take 1 tablet by mouth daily.       Omega-3 Fatty Acids (FISH OIL) 1000 MG CAPS Take 1 capsule by mouth daily.     prochlorperazine (COMPAZINE) 10 MG tablet Take 1 tablet (10 mg total) by mouth 2 (two) times daily as needed for nausea or vomiting. 10 tablet 0   vitamin C (ASCORBIC ACID) 500 MG tablet Take 500 mg by mouth daily.     No current facility-administered medications for this visit.    Past Medical History:  Diagnosis Date   Asthma    Depression    Osteoarthritis     Past Surgical History:  Procedure Laterality Date   APPENDECTOMY      Family History  Problem Relation Age of Onset   Hypertension Unknown        family hx   Hypertension Mother    Hypertension Father    Cancer Maternal Grandfather        Brain Cancer -- in his 24s    Allergies as of 05/04/2023   (No Known Allergies)    Social History   Socioeconomic History   Marital status: Married    Spouse name: Not on file   Number of children: Not on file   Years of education: Not on file   Highest education level: Not on file  Occupational History   Not on file  Tobacco Use   Smoking status: Never   Smokeless tobacco: Never  Vaping Use   Vaping Use: Not on file  Substance and Sexual Activity   Alcohol use: No   Drug use: No   Sexual activity: Yes  Other Topics Concern    Not on file  Social History Narrative   Not on file   Social Determinants of Health   Financial Resource Strain: Not on file  Food Insecurity: Not on file  Transportation Needs: Not on file  Physical Activity: Not on file  Stress: Not on file  Social Connections: Not on file  Intimate Partner Violence: Not on file     Review of Systems   Gen: Denies any fever, chills, fatigue, weight loss, lack of appetite.  CV: Denies chest pain, heart palpitations, peripheral  edema, syncope.  Resp: Denies shortness of breath at rest or with exertion. Denies wheezing or cough.  GI: see HPI GU : Denies urinary burning, urinary frequency, urinary hesitancy MS: Denies joint pain, muscle weakness, cramps, or limitation of movement.  Derm: Denies rash, itching, dry skin Psych: Denies depression, anxiety, memory loss, and confusion Heme: Denies bruising, bleeding, and enlarged lymph nodes.   Physical Exam   BP 134/78 (BP Location: Right Arm, Patient Position: Sitting, Cuff Size: Large)   Pulse 76   Temp 98.1 F (36.7 C) (Oral)   Ht 5\' 7"  (1.702 m)   Wt 184 lb 9.6 oz (83.7 kg)   SpO2 97%   BMI 28.91 kg/m   General:   Alert and oriented. Pleasant and cooperative. Well-nourished and well-developed.  Head:  Normocephalic and atraumatic. Eyes:  Without icterus, sclera clear and conjunctiva pink.  Ears:  Normal auditory acuity. Mouth:  No deformity or lesions, oral mucosa pink.  Lungs:  Clear to auscultation bilaterally. No wheezes, rales, or rhonchi. No distress.  Heart:  S1, S2 present without murmurs appreciated.  Abdomen:  +BS, soft, non-tender and non-distended. No HSM noted. No guarding or rebound. No masses appreciated.  Rectal:  Deferred  Msk:  Symmetrical without gross deformities. Normal posture. Extremities:  Without edema. Neurologic:  Alert and  oriented x4;  grossly normal neurologically. Skin:  Intact without significant lesions or rashes. Psych:  Alert and cooperative.  Normal mood and affect.   Assessment   Donald Melendez is a 72 y.o. male with a history of GERD presenting today for ED follow-up of abdominal pain and nausea.  Epigastric pain, Nausea: Has been having symptoms of abdominal pain in the upper abdomen and nausea with some occasional dry heaves since last Tuesday.  Initially went to urgent care after a few days and states that his blood pressure was high and that they only treated him with a shot for nausea and sent him home.  He then went to the ED on the 27th at [facility] where he was told he possibly had pneumonia that was contributing to his nausea and some of his upper abdominal pain given that he was having some trouble breathing as well.  Was also treated at [facility], ED with a GI cocktail which did not relieve his symptoms and states he was then again told that this could be coming from the antibiotics and his pneumonia.  He does report a remote history of GERD for which she took medication briefly for many years prior.  Tries not to eat late at night.  Denies any frequent NSAID use as of recently, used to take ibuprofen once daily.  States Zofran Phenergan was not helpful previously.  Was given Compazine prescription which she has not really used for nausea.  This is also causing a lack of appetite.  Advised this is likely uncontrolled acid reflux or some possible gastritis or H. pylori.  Advised PPI once daily, following a GERD diet, and given some mild dysphagia we are also proceeding with an upper endoscopy with plus possible dilation.  Elevated LFTs: AST 65, ALT 77, alk phos 132 and normal T. bili on most recent labs.  RUQ [place] consistent with hepatic steatosis.  Advised that this could be secondary to increased weight around his waist as well as dietary factors.  Will plan to recheck liver enzymes in 3 months and if no improvement will consider additional serologic workup.  Given some mild intermittent constipation related to decreased p.o.  intake advised him to increase water intake and use a stool softener as needed.  PLAN   Pantoprazole 40 mg once daily.  GERD diet Continue compazine as needed. Proceed with upper endoscopy +/- dilation with propofol by Dr. Marletta Lor in near future: the risks, benefits, and alternatives have been discussed with the patient in detail. The patient states understanding and desires to proceed. ASA 2 Stool softener as needed.  HFP in 3 months.  Discuss need for colonoscopy at follow up.  Follow up in 8 weeks.    Brooke Bonito, MSN, FNP-BC, AGACNP-BC [facility]

## 2023-05-04 NOTE — H&P (View-Only) (Signed)
  GI Office Note    Referring Provider: No ref. provider found Primary Care Physician:  Pcp, No  Primary Gastroenterologist: Charles K. Carver, DO  Chief Complaint   Chief Complaint  Patient presents with   Abdominal Pain    Abdominal pain and nausea been going on for a week and a half been to two different hospitals and an urgent care.   History of Present Illness   Donald Melendez is a 71 y.o. male presenting today at the request of No ref. provider found for nausea and abdominal pain.  ED visit at [facility] 05/01/2023 for 5 to 6 days of persistent nausea.  Also was having left upper quadrant pain that had resolved.  Last vomiting episode 4 days prior.  Went to urgent care and was given Zofran and Phenergan which she reported did not help.  Reported 1 loose stool previously and minimal stool output since then.  Also feeling bloated like he cannot get a deep breath.  Reported remote appendectomy in the 1970s.  Had evidence of leukocytosis and mild anemia on labs.  Also with mild hyponatremia and borderline elevated LFTs.  Lipase normal.  CT A/P with clustered nodular densities and airspace opacities in the posterior.  Right lower lobe, infectious versus inflammatory.  No acute findings in the abdomen or pelvis.  No focal liver abnormality.  Gallbladder unremarkable.  UA negative.  Given antibiotics to treat for community-acquired pneumonia.  Gave Compazine for nausea.  Advise GI follow-up if symptoms persisted.  Represented to [place] ED on 5/28 for abdominal discomfort and nausea.  Stated he tried to call GI but did not answer the phone so the patient and his wife came to the ED.  Again complaining of epigastric abdominal discomfort and nausea and not having any improvement with Zofran or Phenergan for the past week.  Also feeling bloated and having small bowel movements with inability to eat much.  Lipase within normal limits.  Mild hyponatremia and elevated liver enzymes on CMP.  RUQ US  with diffuse increased echogenicity of the liver most commonly consistent with steatosis.  Advised to follow-up with a chest x-ray in 3-4 weeks.  Provided with GI cocktail which did not relieve his symptoms.  EDP felt as though nausea was exacerbated by his pneumonia and that it may resolve over time with antibiotic treatment.  Patient declined offer of Zofran and Phenergan.  Patient stating that he is using ice packs on his abdomen to provide relief.  Labs 05/02/23: Sodium 134, glucose 107, albumin 3.1, AST 65, ALT 77, alk phos 132.  Hemoglobin 13.6, WBC 13.9.  Today: Has had nausea and abdominal pain since last Tuesday. After exercising that night he starting feeling like he could have the flu. The first day he had LUQ pain and then also having nausea. Wife states he was throwing up/dry heaves in the urgent care office. States at the urgent care office they gave him a shot in his bottom and zofran and then sent him home without any answers.   This is affecting his sleeping as well.   Does not feel like eating. Has a headache in the afternoons and unable to look at bright lights. Nausea is still there and does not want to really eat much. Had a half PBJ yesterday. Does not throw up right away after eating. Has been taking the Augmentin.Has had some diarrhea since being on the Augmentin. Had one episode of diarrhea last week due to urgency.   Has   been working on trying to get a primary care provider because he does not have one. Can't not get in until July and August.   Has not really taken compazine.   Prior to all these symptoms occurring he would take ibuprofen once daily. Has been taking it less frequently since all of this started.   Before all of this she was regular but has had some mild constipation since all oft his started and did take a stool softener and then that helped him go. He does note a history of reflux in the past. Tries to not eat late at night anymore and trying to avoid a lot  of that. Does have some intermittent dysphagia but is very rare.   No chest pain or shortness of breath. No melena or brbpr.    Current Outpatient Medications  Medication Sig Dispense Refill   albuterol (PROVENTIL HFA;VENTOLIN HFA) 108 (90 Base) MCG/ACT inhaler Inhale 2 puffs into the lungs every 6 (six) hours as needed for wheezing or shortness of breath. 1 Inhaler 2   amoxicillin-clavulanate (AUGMENTIN) 875-125 MG tablet Take 1 tablet by mouth every 12 (twelve) hours. 14 tablet 0   Ibuprofen-Diphenhydramine Cit (IBUPROFEN PM PO) Take 1 tablet by mouth at bedtime as needed (sleep).     Multiple Vitamin (MULTIVITAMIN) tablet Take 1 tablet by mouth daily.       Omega-3 Fatty Acids (FISH OIL) 1000 MG CAPS Take 1 capsule by mouth daily.     prochlorperazine (COMPAZINE) 10 MG tablet Take 1 tablet (10 mg total) by mouth 2 (two) times daily as needed for nausea or vomiting. 10 tablet 0   vitamin C (ASCORBIC ACID) 500 MG tablet Take 500 mg by mouth daily.     No current facility-administered medications for this visit.    Past Medical History:  Diagnosis Date   Asthma    Depression    Osteoarthritis     Past Surgical History:  Procedure Laterality Date   APPENDECTOMY      Family History  Problem Relation Age of Onset   Hypertension Unknown        family hx   Hypertension Mother    Hypertension Father    Cancer Maternal Grandfather        Brain Cancer -- in his 80s    Allergies as of 05/04/2023   (No Known Allergies)    Social History   Socioeconomic History   Marital status: Married    Spouse name: Not on file   Number of children: Not on file   Years of education: Not on file   Highest education level: Not on file  Occupational History   Not on file  Tobacco Use   Smoking status: Never   Smokeless tobacco: Never  Vaping Use   Vaping Use: Not on file  Substance and Sexual Activity   Alcohol use: No   Drug use: No   Sexual activity: Yes  Other Topics Concern    Not on file  Social History Narrative   Not on file   Social Determinants of Health   Financial Resource Strain: Not on file  Food Insecurity: Not on file  Transportation Needs: Not on file  Physical Activity: Not on file  Stress: Not on file  Social Connections: Not on file  Intimate Partner Violence: Not on file     Review of Systems   Gen: Denies any fever, chills, fatigue, weight loss, lack of appetite.  CV: Denies chest pain, heart palpitations, peripheral   edema, syncope.  Resp: Denies shortness of breath at rest or with exertion. Denies wheezing or cough.  GI: see HPI GU : Denies urinary burning, urinary frequency, urinary hesitancy MS: Denies joint pain, muscle weakness, cramps, or limitation of movement.  Derm: Denies rash, itching, dry skin Psych: Denies depression, anxiety, memory loss, and confusion Heme: Denies bruising, bleeding, and enlarged lymph nodes.   Physical Exam   BP 134/78 (BP Location: Right Arm, Patient Position: Sitting, Cuff Size: Large)   Pulse 76   Temp 98.1 F (36.7 C) (Oral)   Ht 5' 7" (1.702 m)   Wt 184 lb 9.6 oz (83.7 kg)   SpO2 97%   BMI 28.91 kg/m   General:   Alert and oriented. Pleasant and cooperative. Well-nourished and well-developed.  Head:  Normocephalic and atraumatic. Eyes:  Without icterus, sclera clear and conjunctiva pink.  Ears:  Normal auditory acuity. Mouth:  No deformity or lesions, oral mucosa pink.  Lungs:  Clear to auscultation bilaterally. No wheezes, rales, or rhonchi. No distress.  Heart:  S1, S2 present without murmurs appreciated.  Abdomen:  +BS, soft, non-tender and non-distended. No HSM noted. No guarding or rebound. No masses appreciated.  Rectal:  Deferred  Msk:  Symmetrical without gross deformities. Normal posture. Extremities:  Without edema. Neurologic:  Alert and  oriented x4;  grossly normal neurologically. Skin:  Intact without significant lesions or rashes. Psych:  Alert and cooperative.  Normal mood and affect.   Assessment   Donald Melendez is a 71 y.o. male with a history of GERD presenting today for ED follow-up of abdominal pain and nausea.  Epigastric pain, Nausea: Has been having symptoms of abdominal pain in the upper abdomen and nausea with some occasional dry heaves since last Tuesday.  Initially went to urgent care after a few days and states that his blood pressure was high and that they only treated him with a shot for nausea and sent him home.  He then went to the ED on the 27th at [facility] where he was told he possibly had pneumonia that was contributing to his nausea and some of his upper abdominal pain given that he was having some trouble breathing as well.  Was also treated at [place], ED with a GI cocktail which did not relieve his symptoms and states he was then again told that this could be coming from the antibiotics and his pneumonia.  He does report a remote history of GERD for which she took medication briefly for many years prior.  Tries not to eat late at night.  Denies any frequent NSAID use as of recently, used to take ibuprofen once daily.  States Zofran Phenergan was not helpful previously.  Was given Compazine prescription which she has not really used for nausea.  This is also causing a lack of appetite.  Advised this is likely uncontrolled acid reflux or some possible gastritis or H. pylori.  Advised PPI once daily, following a GERD diet, and given some mild dysphagia we are also proceeding with an upper endoscopy with plus possible dilation.  Elevated LFTs: AST 65, ALT 77, alk phos 132 and normal T. bili on most recent labs.  RUQ US consistent with hepatic steatosis.  Advised that this could be secondary to increased weight around his waist as well as dietary factors.  Will plan to recheck liver enzymes in 3 months and if no improvement will consider additional serologic workup.  Given some mild intermittent constipation related to decreased p.o.    intake advised him to increase water intake and use a stool softener as needed.  PLAN   Pantoprazole 40 mg once daily.  GERD diet Continue compazine as needed. Proceed with upper endoscopy +/- dilation with propofol by Dr. Carver in near future: the risks, benefits, and alternatives have been discussed with the patient in detail. The patient states understanding and desires to proceed. ASA 2 Stool softener as needed.  HFP in 3 months.  Discuss need for colonoscopy at follow up.  Follow up in 8 weeks.    Solly Derasmo, MSN, FNP-BC, AGACNP-BC [facility] 

## 2023-05-04 NOTE — Patient Instructions (Addendum)
Start pantoprazole 40 mg daily, 30 minutes prior to breakfast.   Continue compazine as needed for nausea.   Follow a GERD diet:  Avoid fried, fatty, greasy, spicy, citrus foods. Avoid caffeine and carbonated beverages. Avoid chocolate. Try eating 4-6 small meals a day rather than 3 large meals. Do not eat within 3 hours of laying down. Prop head of bed up on wood or bricks to create a 6 inch incline.  We will get you scheduled for an upper endoscopy in the near future with Dr. Marletta Lor.   Follow up in 8 weeks.   It was a pleasure to see you today. I want to create trusting relationships with patients. If you receive a survey regarding your visit,  I greatly appreciate you taking time to fill this out on paper or through your MyChart. I value your feedback.  Brooke Bonito, MSN, FNP-BC, AGACNP-BC [facility]

## 2023-05-05 ENCOUNTER — Telehealth: Payer: Self-pay | Admitting: *Deleted

## 2023-05-05 NOTE — Telephone Encounter (Signed)
LMTCB to call back to schedule EGD +/-ED with Dr. Marletta Lor, ASA 2

## 2023-05-09 ENCOUNTER — Other Ambulatory Visit (HOSPITAL_COMMUNITY): Payer: Self-pay | Admitting: Family Medicine

## 2023-05-09 DIAGNOSIS — R109 Unspecified abdominal pain: Secondary | ICD-10-CM

## 2023-05-10 ENCOUNTER — Encounter: Payer: Self-pay | Admitting: *Deleted

## 2023-05-10 NOTE — Telephone Encounter (Signed)
Pt has been scheduled for 05/24/23. Instructions printed and placed up front for his spouse to pick up tomorrow.

## 2023-05-16 ENCOUNTER — Encounter (HOSPITAL_COMMUNITY)
Admission: RE | Admit: 2023-05-16 | Discharge: 2023-05-16 | Disposition: A | Discharge status: Home / Self Care | Payer: Medicare Other | Source: Ambulatory Visit | Attending: Family Medicine | Admitting: Family Medicine

## 2023-05-16 DIAGNOSIS — R109 Unspecified abdominal pain: Secondary | ICD-10-CM | POA: Insufficient documentation

## 2023-05-16 MED ORDER — TECHNETIUM TC 99M MEBROFENIN IV KIT
5.0000 | PACK | Freq: Once | INTRAVENOUS | Status: AC | PRN
  Administered 2023-05-16: 5 via INTRAVENOUS

## 2023-05-19 ENCOUNTER — Encounter (HOSPITAL_COMMUNITY): Payer: Self-pay

## 2023-05-19 ENCOUNTER — Encounter (HOSPITAL_COMMUNITY)
Admission: RE | Admit: 2023-05-19 | Discharge: 2023-05-19 | Disposition: A | Discharge status: Home / Self Care | Payer: Medicare Other | Source: Ambulatory Visit | Attending: Internal Medicine | Admitting: Internal Medicine

## 2023-05-19 ENCOUNTER — Other Ambulatory Visit (HOSPITAL_COMMUNITY): Payer: Self-pay | Admitting: Family Medicine

## 2023-05-19 DIAGNOSIS — J189 Pneumonia, unspecified organism: Secondary | ICD-10-CM

## 2023-05-24 ENCOUNTER — Ambulatory Visit (HOSPITAL_BASED_OUTPATIENT_CLINIC_OR_DEPARTMENT_OTHER): Payer: Medicare Other | Admitting: Anesthesiology

## 2023-05-24 ENCOUNTER — Ambulatory Visit (HOSPITAL_COMMUNITY): Payer: Medicare Other | Admitting: Anesthesiology

## 2023-05-24 ENCOUNTER — Encounter (HOSPITAL_COMMUNITY): Payer: Self-pay

## 2023-05-24 ENCOUNTER — Encounter (HOSPITAL_COMMUNITY)
Admission: RE | Disposition: A | Discharge status: Home / Self Care | Payer: Self-pay | Source: Home / Self Care | Attending: Internal Medicine

## 2023-05-24 ENCOUNTER — Ambulatory Visit (HOSPITAL_COMMUNITY)
Admission: RE | Admit: 2023-05-24 | Discharge: 2023-05-24 | Disposition: A | Discharge status: Home / Self Care | Payer: Medicare Other | Attending: Internal Medicine | Admitting: Internal Medicine

## 2023-05-24 DIAGNOSIS — K222 Esophageal obstruction: Secondary | ICD-10-CM

## 2023-05-24 DIAGNOSIS — Z8701 Personal history of pneumonia (recurrent): Secondary | ICD-10-CM | POA: Diagnosis not present

## 2023-05-24 DIAGNOSIS — Z9049 Acquired absence of other specified parts of digestive tract: Secondary | ICD-10-CM | POA: Diagnosis not present

## 2023-05-24 DIAGNOSIS — R110 Nausea: Secondary | ICD-10-CM | POA: Insufficient documentation

## 2023-05-24 DIAGNOSIS — R1013 Epigastric pain: Secondary | ICD-10-CM

## 2023-05-24 DIAGNOSIS — K319 Disease of stomach and duodenum, unspecified: Secondary | ICD-10-CM | POA: Diagnosis not present

## 2023-05-24 DIAGNOSIS — J45909 Unspecified asthma, uncomplicated: Secondary | ICD-10-CM | POA: Diagnosis not present

## 2023-05-24 DIAGNOSIS — K449 Diaphragmatic hernia without obstruction or gangrene: Secondary | ICD-10-CM | POA: Insufficient documentation

## 2023-05-24 DIAGNOSIS — R12 Heartburn: Secondary | ICD-10-CM | POA: Diagnosis not present

## 2023-05-24 DIAGNOSIS — R1310 Dysphagia, unspecified: Secondary | ICD-10-CM

## 2023-05-24 DIAGNOSIS — Z79899 Other long term (current) drug therapy: Secondary | ICD-10-CM | POA: Insufficient documentation

## 2023-05-24 DIAGNOSIS — K5900 Constipation, unspecified: Secondary | ICD-10-CM | POA: Diagnosis not present

## 2023-05-24 DIAGNOSIS — R1312 Dysphagia, oropharyngeal phase: Secondary | ICD-10-CM | POA: Insufficient documentation

## 2023-05-24 DIAGNOSIS — K2970 Gastritis, unspecified, without bleeding: Secondary | ICD-10-CM

## 2023-05-24 HISTORY — PX: BIOPSY: SHX5522

## 2023-05-24 HISTORY — PX: ESOPHAGOGASTRODUODENOSCOPY (EGD) WITH PROPOFOL: SHX5813

## 2023-05-24 SURGERY — ESOPHAGOGASTRODUODENOSCOPY (EGD) WITH PROPOFOL
Anesthesia: General

## 2023-05-24 MED ORDER — PROPOFOL 10 MG/ML IV BOLUS
INTRAVENOUS | Status: DC | PRN
  Administered 2023-05-24: 50 mg via INTRAVENOUS
  Administered 2023-05-24: 100 mg via INTRAVENOUS
  Administered 2023-05-24: 50 mg via INTRAVENOUS

## 2023-05-24 MED ORDER — LACTATED RINGERS IV SOLN: INTRAVENOUS | Status: DC

## 2023-05-24 MED ORDER — PANTOPRAZOLE SODIUM 40 MG PO TBEC: 40.0000 mg | DELAYED_RELEASE_TABLET | Freq: Two times a day (BID) | ORAL | 11 refills | Status: DC

## 2023-05-24 MED ORDER — LIDOCAINE HCL 1 % IJ SOLN
INTRAMUSCULAR | Status: DC | PRN
  Administered 2023-05-24: 50 mg via INTRADERMAL

## 2023-05-24 NOTE — Transfer of Care (Signed)
Immediate Anesthesia Transfer of Care Note  Patient: Donald Melendez  Procedure(s) Performed: ESOPHAGOGASTRODUODENOSCOPY (EGD) WITH PROPOFOL BIOPSY  Patient Location: Short Stay  Anesthesia Type:General  Level of Consciousness: awake  Airway & Oxygen Therapy: Patient Spontanous Breathing  Post-op Assessment: Report given to RN  Post vital signs: Reviewed and stable  Last Vitals:  Vitals Value Taken Time  BP    Temp    Pulse    Resp    SpO2      Last Pain:  Vitals:   05/24/23 1104  PainSc: 0-No pain         Complications: No notable events documented.

## 2023-05-24 NOTE — Anesthesia Preprocedure Evaluation (Signed)
Anesthesia Evaluation  Patient identified by MRN, date of birth, ID band Patient awake    Reviewed: Allergy & Precautions, H&P , NPO status , Patient's Chart, lab work & pertinent test results  Airway Mallampati: IV  TM Distance: >3 FB Neck ROM: Full  Mouth opening: Limited Mouth Opening Comment: Very small mouth opening, possible Difficult airway,Neck sprain  Dental  (+) Caps, Dental Advisory Given,    Pulmonary asthma    Pulmonary exam normal breath sounds clear to auscultation       Cardiovascular hypertension (not on meds), Normal cardiovascular exam Rhythm:Regular Rate:Normal     Neuro/Psych  PSYCHIATRIC DISORDERS Anxiety Depression    negative neurological ROS     GI/Hepatic Neg liver ROS,GERD  Medicated and Poorly Controlled,,  Endo/Other  negative endocrine ROS    Renal/GU negative Renal ROS  negative genitourinary   Musculoskeletal  (+) Arthritis , Osteoarthritis,    Abdominal   Peds negative pediatric ROS (+)  Hematology negative hematology ROS (+)   Anesthesia Other Findings   Reproductive/Obstetrics negative OB ROS                             Anesthesia Physical Anesthesia Plan  ASA: 2  Anesthesia Plan: General   Post-op Pain Management: Minimal or no pain anticipated   Induction: Intravenous  PONV Risk Score and Plan: 1 and Propofol infusion  Airway Management Planned: Nasal Cannula and Natural Airway  Additional Equipment:   Intra-op Plan:   Post-operative Plan:   Informed Consent: I have reviewed the patients History and Physical, chart, labs and discussed the procedure including the risks, benefits and alternatives for the proposed anesthesia with the patient or authorized representative who has indicated his/her understanding and acceptance.     Dental advisory given  Plan Discussed with: CRNA and Surgeon  Anesthesia Plan Comments:         Anesthesia Quick Evaluation

## 2023-05-24 NOTE — Anesthesia Postprocedure Evaluation (Signed)
Anesthesia Post Note  Patient: Donald Melendez  Procedure(s) Performed: ESOPHAGOGASTRODUODENOSCOPY (EGD) WITH PROPOFOL BIOPSY  Patient location during evaluation: Short Stay Anesthesia Type: General Level of consciousness: awake and alert Pain management: pain level controlled Vital Signs Assessment: post-procedure vital signs reviewed and stable Respiratory status: spontaneous breathing Cardiovascular status: blood pressure returned to baseline and stable Postop Assessment: no apparent nausea or vomiting Anesthetic complications: no   No notable events documented.   Last Vitals:  Vitals:   05/24/23 1000 05/24/23 1116  BP: 136/85 (!) 104/55  Pulse: 75 69  Resp: 16 16  Temp: 36.7 C 36.6 C  SpO2: 96% 94%    Last Pain:  Vitals:   05/24/23 1116  TempSrc: Oral  PainSc: 0-No pain                 Rayleigh Gillyard

## 2023-05-24 NOTE — Op Note (Signed)
[facility] Patient Name: Donald Melendez Procedure Date: 05/24/2023 10:54 AM MRN: [id number] Date of Birth: [birth year] Attending MD: Hennie Duos. Marletta Lor , [place], [id number] CSN: [id number] Age: 72 Admit Type: Outpatient Procedure:                Upper GI endoscopy Indications:              Oropharyngeal phase dysphagia, Heartburn, Nausea Providers:                Hennie Duos. Marletta Lor, DO, Jannett Celestine, RN, Cyril Mourning, Technician Referring MD:              Medicines:                See the Anesthesia note for documentation of the                            administered medications Complications:            No immediate complications. Estimated Blood Loss:     Estimated blood loss was minimal. Procedure:                Pre-Anesthesia Assessment:                           - The anesthesia plan was to use monitored                            anesthesia care (MAC).                           After obtaining informed consent, the endoscope was                            passed under direct vision. Throughout the                            procedure, the patient's blood pressure, pulse, and                            oxygen saturations were monitored continuously. The                            GIF-H190 ([id number]) scope was introduced through the                            mouth, and advanced to the second part of duodenum.                            The upper GI endoscopy was accomplished without                            difficulty. The patient tolerated the procedure                            well.  Scope In: 11:07:35 AM Scope Out: 11:10:30 AM Total Procedure Duration: 0 hours 2 minutes 55 seconds  Findings:      A small hiatal hernia was present.      A mild Schatzki ring was found in the distal esophagus. Patient noted       occasionaly episode of oropharyngeal dysphagia (last episode oevr a year       ago). Denied any esophageal dyspgia, as  such dilation not performed.      Diffuse mild inflammation characterized by erythema was found in the       entire examined stomach. Biopsies were taken with a cold forceps for       Helicobacter pylori testing.      The duodenal bulb, first portion of the duodenum and second portion of       the duodenum were normal. Impression:               - Small hiatal hernia.                           - Mild Schatzki ring.                           - Gastritis. Biopsied.                           - Normal duodenal bulb, first portion of the                            duodenum and second portion of the duodenum. Moderate Sedation:      Per Anesthesia Care Recommendation:           - Patient has a contact number available for                            emergencies. The signs and symptoms of potential                            delayed complications were discussed with the                            patient. Return to normal activities tomorrow.                            Written discharge instructions were provided to the                            patient.                           - Resume previous diet.                           - Continue present medications.                           - Await pathology results.                           -  Repeat upper endoscopy PRN for esophageal                            dilation if develops esophageal dysphagia.                           - Return to GI clinic in 6 weeks.                           - Use Protonix (pantoprazole) 40 mg PO BID for 12                            weeks. Procedure Code(s):        --- Professional ---                           [phone], Esophagogastroduodenoscopy, flexible,                            transoral; with biopsy, single or multiple Diagnosis Code(s):        --- Professional ---                           K44.9, Diaphragmatic hernia without obstruction or                            gangrene                           K22.2,  Esophageal obstruction                           K29.70, Gastritis, unspecified, without bleeding                           R13.12, Dysphagia, oropharyngeal phase                           R12, Heartburn                           R11.0, Nausea CPT copyright 2022 American Medical Association. All rights reserved. The codes documented in this report are preliminary and upon coder review may  be revised to meet current compliance requirements. Hennie Duos. Marletta Lor, DO Hennie Duos. Marletta Lor, DO 05/24/2023 11:17:39 AM This report has been signed electronically. Number of Addenda: 0

## 2023-05-24 NOTE — Discharge Instructions (Addendum)
EGD Discharge instructions Please read the instructions outlined below and refer to this sheet in the next few weeks. These discharge instructions provide you with general information on caring for yourself after you leave the hospital. Your doctor may also give you specific instructions. While your treatment has been planned according to the most current medical practices available, unavoidable complications occasionally occur. If you have any problems or questions after discharge, please call your doctor. ACTIVITY You may resume your regular activity but move at a slower pace for the next 24 hours.  Take frequent rest periods for the next 24 hours.  Walking will help expel (get rid of) the air and reduce the bloated feeling in your abdomen.  No driving for 24 hours (because of the anesthesia (medicine) used during the test).  You may shower.  Do not sign any important legal documents or operate any machinery for 24 hours (because of the anesthesia used during the test).  NUTRITION Drink plenty of fluids.  You may resume your normal diet.  Begin with a light meal and progress to your normal diet.  Avoid alcoholic beverages for 24 hours or as instructed by your caregiver.  MEDICATIONS You may resume your normal medications unless your caregiver tells you otherwise.  WHAT YOU CAN EXPECT TODAY You may experience abdominal discomfort such as a feeling of fullness or "gas" pains.  FOLLOW-UP Your doctor will discuss the results of your test with you.  SEEK IMMEDIATE MEDICAL ATTENTION IF ANY OF THE FOLLOWING OCCUR: Excessive nausea (feeling sick to your stomach) and/or vomiting.  Severe abdominal pain and distention (swelling).  Trouble swallowing.  Temperature over 101 F (37.8 C).  Rectal bleeding or vomiting of blood.   Your EGD revealed mild amount inflammation in your stomach.  I took biopsies of this to rule out infection with a bacteria called H. pylori.  Await pathology results, my  office will contact you.  Small hiatal hernia noted. Small bowel appeared normal.   I am going to increase your pantoprazole to twice daily for the next 12 weeks.   Follow up in GI office in 6 weeks.    I hope you have a great rest of your week!  Hennie Duos. Marletta Lor, D.O. Gastroenterology and Hepatology [facility]

## 2023-05-24 NOTE — Interval H&P Note (Signed)
History and Physical Interval Note:  05/24/2023 10:52 AM  Donald Melendez  has presented today for surgery, with the diagnosis of dysphagia,nausea,epigastric pain.  The various methods of treatment have been discussed with the patient and family. After consideration of risks, benefits and other options for treatment, the patient has consented to  Procedure(s) with comments: ESOPHAGOGASTRODUODENOSCOPY (EGD) WITH PROPOFOL (N/A) - 12:30 pm, asa 2 BALLOON DILATION (N/A) as a surgical intervention.  The patient's history has been reviewed, patient examined, no change in status, stable for surgery.  I have reviewed the patient's chart and labs.  Questions were answered to the patient's satisfaction.     Lanelle Bal

## 2023-05-25 ENCOUNTER — Other Ambulatory Visit (HOSPITAL_COMMUNITY): Payer: Self-pay | Admitting: Family Medicine

## 2023-05-25 ENCOUNTER — Encounter (HOSPITAL_COMMUNITY): Payer: Self-pay

## 2023-05-25 ENCOUNTER — Ambulatory Visit (HOSPITAL_COMMUNITY)
Admission: RE | Admit: 2023-05-25 | Discharge: 2023-05-25 | Disposition: A | Discharge status: Home / Self Care | Payer: Medicare Other | Source: Ambulatory Visit | Attending: Family Medicine | Admitting: Family Medicine

## 2023-05-25 ENCOUNTER — Ambulatory Visit (HOSPITAL_COMMUNITY): Payer: Medicare Other

## 2023-05-25 DIAGNOSIS — J189 Pneumonia, unspecified organism: Secondary | ICD-10-CM

## 2023-05-25 LAB — SURGICAL PATHOLOGY

## 2023-05-29 ENCOUNTER — Encounter (HOSPITAL_COMMUNITY): Payer: Self-pay

## 2023-05-29 ENCOUNTER — Encounter (HOSPITAL_COMMUNITY): Payer: Self-pay | Admitting: Internal Medicine

## 2023-06-28 NOTE — Progress Notes (Signed)
GI Office Note    Referring Provider: No ref. provider found Primary Care Physician:  Oneal Grout, FNP Primary Gastroenterologist: Hennie Duos. Marletta Lor, DO  Date:  06/29/2023  ID:  Donald Melendez, DOB Jan 03, 1951, MRN [id number]   Chief Complaint   Chief Complaint  Patient presents with   Follow-up    Patient here today for a follow up on abdominal/epigastric pain. Patient states the pain has gotten better. Patient denies any current gi issues.Patient is taking pantoprazole 40 mg 1-2 per day prn.   History of Present Illness  Donald Melendez is a 72 y.o. male with a history of GERD presenting today for follow up of GERD, nausea. epigastric pain and nausea.   ED visit at [facility] 05/01/2023 for 5 to 6 days of persistent nausea.  Also was having left upper quadrant pain that had resolved.  Last vomiting episode 4 days prior.  Went to urgent care and was given Zofran and Phenergan which she reported did not help.  Reported 1 loose stool previously and minimal stool output since then.  Also feeling bloated like he cannot get a deep breath.  Reported remote appendectomy in the 1970s.  Had evidence of leukocytosis and mild anemia on labs.  Also with mild hyponatremia and borderline elevated LFTs.  Lipase normal.  CT A/P with clustered nodular densities and airspace opacities in the posterior.  Right lower lobe, infectious versus inflammatory.  No acute findings in the abdomen or pelvis.  No focal liver abnormality.  Gallbladder unremarkable.  UA negative.  Given antibiotics to treat for community-acquired pneumonia.  Gave Compazine for nausea.  Advise GI follow-up if symptoms persisted.   Represented to [facility] ED on 5/28 for abdominal discomfort and nausea.  Stated he tried to call GI but did not answer the phone so the patient and his wife came to the ED.  Again complaining of epigastric abdominal discomfort and nausea and not having any improvement with Zofran or Phenergan for the past  week.  Also feeling bloated and having small bowel movements with inability to eat much.  Lipase within normal limits.  Mild hyponatremia and elevated liver enzymes on CMP.  RUQ [place] with diffuse increased echogenicity of the liver most commonly consistent with steatosis.  Advised to follow-up with a chest x-ray in 3-4 weeks.  Provided with GI cocktail which did not relieve his symptoms.  EDP felt as though nausea was exacerbated by his pneumonia and that it may resolve over time with antibiotic treatment.  Patient declined offer of Zofran and Phenergan.  Patient stating that he is using ice packs on his abdomen to provide relief.   Labs 05/02/23: Sodium 134, glucose 107, albumin 3.1, AST 65, ALT 77, alk phos 132.  Hemoglobin 13.6, WBC 13.9.  RUQ [place] 05/02/23: -increased echogenicity suggesting steatosis.   Last office visit 05/04/23. Recent abdominal pain, nausea. Also some LUQ pain. Symptoms affecting his sleep. Decreased appetite. No chest pain, shortness of breath. Intermittent dysphagia present.  PPI daily, EGD + ED, compazine as needed, GERD diet. HFP in 3 months. Plan to discuss colonoscopy at follow up. Stool softener as needed.   EGD 05/24/23: - Small hiatal hernia.  - Mild Schatzki ring.  - Gastritis. Biopsied.  - Normal duodenum. - biopsy with reactive gastropathy, neg H. Pylori.  - recommended PPI BID for 12 weeks. Avoid NSAIDs  HIDA scan 05/30/23: -Gallbladder EF 29% -patent CBD.   Today: Last colonoscopy - about 30 years ago. No family history  of colon cancer.   Grandfather with brain cancer at 64.   Got a PCP and they ordered a HIDA scan. HIDA scan has shown decreased EF.  Likes to snack in the evenings. Has been gaining weight recently. Exercises regularly.   Pneumonia has resolved with antibiotics. Is curious onto what he should eat given his gallbladder. Currently not nauseas and not having any abdominal pain. No dysphagia. Good appetite.   Has been traveling a lot and he  is weary of what to eat.   Current Outpatient Medications  Medication Sig Dispense Refill   albuterol (PROVENTIL HFA;VENTOLIN HFA) 108 (90 Base) MCG/ACT inhaler Inhale 2 puffs into the lungs every 6 (six) hours as needed for wheezing or shortness of breath. 1 Inhaler 2   Ascorbic Acid (VITAMIN C) 1000 MG tablet Take 1,000 mg by mouth daily.     B Complex Vitamins (B COMPLEX PO) Take 1 drop by mouth daily.     Cholecalciferol (VITAMIN D3) 50 MCG (2000 UT) capsule Take 2,000 Units by mouth daily.     Multiple Vitamin (MULTIVITAMIN) tablet Take 1 tablet by mouth daily.       naphazoline-pheniramine (ALLERGY EYE) 0.025-0.3 % ophthalmic solution Place 1 drop into both eyes 4 (four) times daily as needed for allergies.     Omega-3 Fatty Acids (FISH OIL) 1000 MG CAPS Take 1,000 mg by mouth daily.     pantoprazole (PROTONIX) 40 MG tablet Take 1 tablet (40 mg total) by mouth 2 (two) times daily. 60 tablet 11   No current facility-administered medications for this visit.    Past Medical History:  Diagnosis Date   Asthma    Depression    Osteoarthritis     Past Surgical History:  Procedure Laterality Date   APPENDECTOMY     BIOPSY  05/24/2023   Procedure: BIOPSY;  Surgeon: Lanelle Bal, DO;  [facility]: [facility];  Service: Endoscopy;;   ESOPHAGOGASTRODUODENOSCOPY (EGD) WITH PROPOFOL N/A 05/24/2023   Procedure: ESOPHAGOGASTRODUODENOSCOPY (EGD) WITH PROPOFOL;  Surgeon: Lanelle Bal, DO;  Location: [facility];  Service: Endoscopy;  Laterality: N/A;  12:30 pm, asa 2    Family History  Problem Relation Age of Onset   Hypertension Unknown        family hx   Hypertension Mother    Hypertension Father    Cancer Maternal Grandfather        Brain Cancer -- in his 106s    Allergies as of 06/29/2023   (No Known Allergies)    Social History   Socioeconomic History   Marital status: Married    Spouse name: Not on file   Number of children: Not on file   Years of education: Not  on file   Highest education level: Not on file  Occupational History   Not on file  Tobacco Use   Smoking status: Never   Smokeless tobacco: Never  Vaping Use   Vaping status: Not on file  Substance and Sexual Activity   Alcohol use: No   Drug use: No   Sexual activity: Yes  Other Topics Concern   Not on file  Social History Narrative   ** Merged History Encounter **       Social Determinants of Health   Financial Resource Strain: Not on file  Food Insecurity: Not on file  Transportation Needs: Not on file  Physical Activity: Not on file  Stress: Not on file  Social Connections: Not on file  Review of Systems   Gen: Denies fever, chills, anorexia. Denies fatigue, weakness, weight loss.  CV: Denies chest pain, palpitations, syncope, peripheral edema, and claudication. Resp: Denies dyspnea at rest, cough, wheezing, coughing up blood, and pleurisy. GI: See HPI Derm: Denies rash, itching, dry skin Psych: Denies depression, anxiety, memory loss, confusion. No homicidal or suicidal ideation.  Heme: Denies bruising, bleeding, and enlarged lymph nodes.   Physical Exam   BP 132/82 (BP Location: Left Arm, Patient Position: Sitting, Cuff Size: Normal)   Pulse 87   Temp (!) 97.1 F (36.2 C) (Temporal)   Ht 5\' 6"  (1.676 m)   Wt 189 lb 6.4 oz (85.9 kg)   BMI 30.57 kg/m   General:   Alert and oriented. No distress noted. Pleasant and cooperative.  Head:  Normocephalic and atraumatic. Eyes:  Conjuctiva clear without scleral icterus. Mouth:  Oral mucosa pink and moist. Good dentition. No lesions. Lungs:  Clear to auscultation bilaterally. No wheezes, rales, or rhonchi. No distress.  Heart:  S1, S2 present without murmurs appreciated.  Abdomen:  +BS, soft, non-tender and non-distended. No rebound or guarding. No HSM or masses noted. Rectal: deferred Msk:  Symmetrical without gross deformities. Normal posture. Extremities:  Without edema. Neurologic:  Alert and  oriented  x4 Psych:  Alert and cooperative. Normal mood and affect.   Assessment  Donald Melendez is a 72 y.o. male with a history of GERD, asthma, and depression presenting today for follow up of GERD, nausea.   Epigastric pain, nausea, gastritis: Previously with epigastric pain and nausea prior to initial office visit that prompted UC and ED visits. Pneumonia that he had at that time was treated and since being on PPI he has had significant improvement in symptoms. PCP ordered HIDA scan that revealed EF of 29% indicative of biliary dyskinesia which could be contributor to his prior symptoms. He is fearful of another "attack". We discussed the unknown regarding if removal would prevent symptoms and what side effects he may have and given no recent return of symptoms that we can hold off on surgical evaluation. EGD revealed gastritis and biopsies negative for H. Pylori but did indicate reactive gastropathy. He will continue on PPI for total 12 weeks and then will reduce to once daily. We discussed diet recommendations in detail today and provided separate written education on biliary dyskinesia and gallbladder diet.   Elevated LFTs: Recent RUQ [place] with hepatic steatosis. Will check hepatitis panel and fibrosure to better assess what level of fibrosis is present. LFTs elevated in May. Will plan to recheck these as well while checking for viral hepatitis.   Screen colon cancer: Stool sards x3. Offered colonoscopy. However patient declined. He has agreed to FOBT cards and will bring for testing periodically. If positive we discussed proceeding with colonoscopy.   PLAN   HFP, acute hepatitis panel, fibrosure ([facility]) PPI BID for total 3 months then reduce to once daily Stool cards x3 Gallbladder diet  Biliary dyskinesia education provided Follow up in 4 months.   Brooke Bonito, MSN, FNP-BC, AGACNP-BC [facility]

## 2023-06-29 ENCOUNTER — Encounter: Payer: Self-pay | Admitting: Gastroenterology

## 2023-06-29 ENCOUNTER — Ambulatory Visit (INDEPENDENT_AMBULATORY_CARE_PROVIDER_SITE_OTHER): Payer: Medicare Other | Admitting: Gastroenterology

## 2023-06-29 VITALS — BP 132/82 | HR 87 | Temp 97.1°F | Ht 66.0 in | Wt 189.4 lb

## 2023-06-29 DIAGNOSIS — K7581 Nonalcoholic steatohepatitis (NASH): Secondary | ICD-10-CM

## 2023-06-29 DIAGNOSIS — R7989 Other specified abnormal findings of blood chemistry: Secondary | ICD-10-CM | POA: Diagnosis not present

## 2023-06-29 NOTE — Patient Instructions (Addendum)
Please see education attached regarding diet and information about biliary dyskinesia.   Continue pantoprazole 40 mg twice daily.   Please go to [facility] if able to have labs performed to recheck your liver enzymes.   We will complete stool cards for colon cancer screening.   Follow up in 4 months.  It was a pleasure to see you today. I want to create trusting relationships with patients. If you receive a survey regarding your visit,  I greatly appreciate you taking time to fill this out on paper or through your MyChart. I value your feedback.  Brooke Bonito, MSN, FNP-BC, AGACNP-BC [facility]

## 2023-07-02 ENCOUNTER — Encounter: Payer: Self-pay | Admitting: Gastroenterology

## 2023-07-13 ENCOUNTER — Encounter: Payer: Self-pay | Admitting: *Deleted

## 2023-07-18 ENCOUNTER — Other Ambulatory Visit: Payer: Self-pay | Admitting: *Deleted

## 2023-07-18 ENCOUNTER — Ambulatory Visit (INDEPENDENT_AMBULATORY_CARE_PROVIDER_SITE_OTHER): Payer: Medicare Other | Admitting: Gastroenterology

## 2023-07-18 DIAGNOSIS — Z1211 Encounter for screening for malignant neoplasm of colon: Secondary | ICD-10-CM

## 2023-07-18 LAB — POC HEMOCCULT BLD/STL (HOME/3-CARD/SCREEN)
Card #2 Fecal Occult Blod, POC: NEGATIVE
Card #3 Fecal Occult Blood, POC: NEGATIVE
Fecal Occult Blood, POC: NEGATIVE

## 2023-07-31 ENCOUNTER — Encounter: Payer: Self-pay | Admitting: *Deleted

## 2023-10-31 ENCOUNTER — Ambulatory Visit (INDEPENDENT_AMBULATORY_CARE_PROVIDER_SITE_OTHER): Payer: Medicare Other | Admitting: Gastroenterology

## 2023-10-31 ENCOUNTER — Encounter: Payer: Self-pay | Admitting: Gastroenterology

## 2023-10-31 VITALS — BP 130/82 | HR 76 | Temp 97.6°F | Ht 67.0 in | Wt 191.6 lb

## 2023-10-31 DIAGNOSIS — K828 Other specified diseases of gallbladder: Secondary | ICD-10-CM | POA: Diagnosis not present

## 2023-10-31 DIAGNOSIS — R7989 Other specified abnormal findings of blood chemistry: Secondary | ICD-10-CM

## 2023-10-31 DIAGNOSIS — K760 Fatty (change of) liver, not elsewhere classified: Secondary | ICD-10-CM

## 2023-10-31 DIAGNOSIS — K2970 Gastritis, unspecified, without bleeding: Secondary | ICD-10-CM | POA: Diagnosis not present

## 2023-10-31 DIAGNOSIS — K7581 Nonalcoholic steatohepatitis (NASH): Secondary | ICD-10-CM

## 2023-10-31 DIAGNOSIS — R1013 Epigastric pain: Secondary | ICD-10-CM

## 2023-10-31 DIAGNOSIS — R110 Nausea: Secondary | ICD-10-CM

## 2023-10-31 NOTE — Progress Notes (Signed)
GI Office Note    Referring Provider: Oneal Grout, FNP Primary Care Physician:  Oneal Grout, FNP Primary Gastroenterologist: Hennie Duos. Marletta Lor, DO  Date:  10/31/2023  ID:  [name], DOB 1951-07-22, MRN [id number]   Chief Complaint   Chief Complaint  Patient presents with   Follow-up    Follow up. No problems    History of Present Illness  Donald Melendez is a 72 y.o. male with a history of GERD, asthma, biliary dyskinesia, and depression presenting today for follow up.   Last office visit 06/29/2023.  Had elevated liver enzymes in May.  Ultrasound showed hepatic steatosis.  He had had a HIDA scan that revealed an EF of 29% indicative of biliary dyskinesia which could be contributing to his epigastric pain and nausea prior to his initial office visit.  Had EGD that revealed gastritis and biopsies negative for H. pylori but with reactive gastropathy.  Advised continue PPI for 12 weeks and then reduce to once daily.  Discussed gallbladder diet. Advised to check HFP, acute hepatitis panel, and FibroSure.  Advised PPI twice daily for 3 months and then reduce to once daily.  FOBT x 3.  Advise gallbladder diet and biliary dyskinesia education provided.  Advise if FOBT cards were positive we should proceed with colonoscopy.  He previously declined.  Labs in July with FibroSure noting F1-F2 fibrosis, minimal to no activity of macro laboratory state.  LFTs were within normal limits.  Acute hepatitis panel negative.  FOBT was negative x 3.  Today: Trying to stay hydrated and eat a higher fiber diet. Eating whole grain. Drinking about 64 oz water daily. So far has been doing okay. Caffie Damme out about once per week. Watching stools all the time. Always cardboard color or darker. Stools sometimes smaller and not as wide as normal but before all this he would only go once I the mornings. Sometimes now he goes 2-3 times a day. Got some prune juice and then coconut oil and then got  cleaned out and then lost 3 pounds. Has not had any episodes since his last visit.   He has decreased his pantoprazole to once daily.   Does a green blend when he works out and branch chain amino acids and creatine. If he is on vacation he does not take it with him. Lately he has ben doing those supplements about 4 times pr week.   Has some facial acne recently as well. Reports some spots on his arms that he thought were insect bites. Last night had some itching as well to the back of his head last night.    Wt Readings from Last 3 Encounters:  10/31/23 191 lb 9.6 oz (86.9 kg)  06/29/23 189 lb 6.4 oz (85.9 kg)  05/19/23 180 lb (81.6 kg)    Current Outpatient Medications  Medication Sig Dispense Refill   albuterol (PROVENTIL HFA;VENTOLIN HFA) 108 (90 Base) MCG/ACT inhaler Inhale 2 puffs into the lungs every 6 (six) hours as needed for wheezing or shortness of breath. 1 Inhaler 2   Ascorbic Acid (VITAMIN C) 1000 MG tablet Take 1,000 mg by mouth daily.     B Complex Vitamins (B COMPLEX PO) Take 1 drop by mouth daily.     Cholecalciferol (VITAMIN D3) 50 MCG (2000 UT) capsule Take 2,000 Units by mouth daily.     glucosamine-chondroitin 500-400 MG tablet Take 1 tablet by mouth 3 (three) times daily.     Lactobacillus (ACIDOPHILUS PROBIOTIC) 100 MG  CAPS Take by mouth.     magnesium gluconate (MAGONATE) 500 MG tablet Take 500 mg by mouth 2 (two) times daily.     Multiple Vitamin (MULTIVITAMIN) tablet Take 1 tablet by mouth daily.       naphazoline-pheniramine (ALLERGY EYE) 0.025-0.3 % ophthalmic solution Place 1 drop into both eyes 4 (four) times daily as needed for allergies.     Omega-3 Fatty Acids (FISH OIL) 1000 MG CAPS Take 1,000 mg by mouth daily.     OVER THE COUNTER MEDICATION Green blend  4 times a week with 8oz of water.     pantoprazole (PROTONIX) 40 MG tablet Take 1 tablet (40 mg total) by mouth 2 (two) times daily. 60 tablet 11   vitamin E 180 MG (400 UNITS) capsule Take 400 Units  by mouth daily.     No current facility-administered medications for this visit.    Past Medical History:  Diagnosis Date   Asthma    Depression    Osteoarthritis     Past Surgical History:  Procedure Laterality Date   APPENDECTOMY     BIOPSY  05/24/2023   Procedure: BIOPSY;  Surgeon: Lanelle Bal, DO;  [facility];  Service: Endoscopy;;   ESOPHAGOGASTRODUODENOSCOPY (EGD) WITH PROPOFOL N/A 05/24/2023   Procedure: ESOPHAGOGASTRODUODENOSCOPY (EGD) WITH PROPOFOL;  Surgeon: Lanelle Bal, DO;  Location: [facility];  Service: Endoscopy;  Laterality: N/A;  12:30 pm, asa 2    Family History  Problem Relation Age of Onset   Hypertension Unknown        family hx   Hypertension Mother    Hypertension Father    Cancer Maternal Grandfather        Brain Cancer -- in his 40s    Allergies as of 10/31/2023   (No Known Allergies)    Social History   Socioeconomic History   Marital status: Married    Spouse name: Not on file   Number of children: Not on file   Years of education: Not on file   Highest education level: Not on file  Occupational History   Not on file  Tobacco Use   Smoking status: Never   Smokeless tobacco: Never  Vaping Use   Vaping status: Not on file  Substance and Sexual Activity   Alcohol use: No   Drug use: No   Sexual activity: Yes  Other Topics Concern   Not on file  Social History Narrative   ** Merged History Encounter **       Social Determinants of Health   Financial Resource Strain: Not on file  Food Insecurity: Not on file  Transportation Needs: Not on file  Physical Activity: Not on file  Stress: Not on file  Social Connections: Not on file   Review of Systems   Gen: Denies fever, chills, anorexia. Denies fatigue, weakness, weight loss.  CV: Denies chest pain, palpitations, syncope, peripheral edema, and claudication. Resp: Denies dyspnea at rest, cough, wheezing, coughing up blood, and pleurisy. GI: See  HPI Derm: Denies rash, itching, dry skin Psych: Denies depression, anxiety, memory loss, confusion. No homicidal or suicidal ideation.  Heme: Denies bruising, bleeding, and enlarged lymph nodes.  Physical Exam   BP 130/82 (BP Location: Right Arm, Patient Position: Sitting, Cuff Size: Normal)   Pulse 76   Temp 97.6 F (36.4 C) (Temporal)   Ht 5\' 7"  (1.702 m)   Wt 191 lb 9.6 oz (86.9 kg)   BMI 30.01 kg/m   General:  Alert and oriented. No distress noted. Pleasant and cooperative.  Head:  Normocephalic and atraumatic. Eyes:  Conjuctiva clear without scleral icterus. Mouth:  Oral mucosa pink and moist. Good dentition. No lesions. Lungs:  Clear to auscultation bilaterally. No wheezes, rales, or rhonchi. No distress.  Heart:  S1, S2 present without murmurs appreciated.  Abdomen:  +BS, soft, non-tender and non-distended. No rebound or guarding. No HSM or masses noted. Rectal: deferred Msk:  Symmetrical without gross deformities. Normal posture. Extremities:  Without edema. Neurologic:  Alert and  oriented x4 Psych:  Alert and cooperative. Normal mood and affect.  Assessment  FLORENCIO PEELE is a 72 y.o. male with a history of GERD, asthma, biliary dyskinesia, and depression presenting today for follow up.   Hepatic steatosis, elevated LFTs: [place] with hepatic steatosis. Hepatitis panel negative. Fibrosure with F1-F2 fibrosis. Is very active but also has elevated lipid panel. Encouraged ongoing low fat diet as this is needed given his biliary dyskinesia. Elevated LFTs in May 2024. LFTs on fibrosure in July revealed normal LFTs. Advised for him to continue current diet and exercise. Will monitor LFTs every 6 months. Discussed diet and lifestyle measures extensively today.   Epigastric pain, nausea, gastritis: Symptoms resolved. Doing well on pantoprazole 40 mg once daily.   Screening for colon cancer: FOBT negative x3. Previously offered colonoscopy and patient declined. Currently  without any alarm symptoms.   PLAN   HFP every 6 months, will notify PCP Continue pantoprazole 40 mg once daily.  Continue daily vitamins Continue daily exercise and lifting as able.  Low fat diet.  Follow up in one year, sooner if needed.     Brooke Bonito, MSN, FNP-BC, AGACNP-BC [facility]

## 2023-10-31 NOTE — Patient Instructions (Signed)
Continue your current diet regimen. Continue low fat diet for the most part.  Occasional meals that are higher in fat or with red meat you are okay as long as you are not having any symptoms.  Continue taking your pantoprazole 40 mg once daily.  Continue your regular exercise and your daily vitamins.  I will send a note to your primary care with the note from this office visit requesting to check your liver enzymes in March along with the rest of your blood work.  We will plan to follow-up in 1 year, sooner if needed!   I hope you have a wonderful Thanksgiving, Christmas, and a happy new year!  It was a pleasure to see you today. I want to create trusting relationships with patients. If you receive a survey regarding your visit,  I greatly appreciate you taking time to fill this out on paper or through your MyChart. I value your feedback.  Brooke Bonito, MSN, FNP-BC, AGACNP-BC [facility]

## 2024-06-05 ENCOUNTER — Other Ambulatory Visit (HOSPITAL_COMMUNITY): Payer: Self-pay | Admitting: Family Medicine

## 2024-06-05 DIAGNOSIS — M5430 Sciatica, unspecified side: Secondary | ICD-10-CM

## 2024-06-08 ENCOUNTER — Ambulatory Visit (HOSPITAL_COMMUNITY)
Admission: RE | Admit: 2024-06-08 | Discharge: 2024-06-08 | Disposition: A | Discharge status: Home / Self Care | Source: Ambulatory Visit | Attending: Family Medicine | Admitting: Family Medicine

## 2024-06-08 DIAGNOSIS — M5430 Sciatica, unspecified side: Secondary | ICD-10-CM | POA: Diagnosis present

## 2024-08-28 ENCOUNTER — Other Ambulatory Visit: Payer: Self-pay | Admitting: Internal Medicine

## 2024-09-13 NOTE — Therapy (Signed)
 OUTPATIENT PHYSICAL THERAPY LOWER EXTREMITY EVALUATION   Patient Name: Donald Melendez MRN: [id number] DOB:1951-04-09, 73 y.o., male Today's Date: 09/16/2024  END OF SESSION:  PT End of Session - 09/16/24 1033     Visit Number 1    Number of Visits 12    Date for Recertification  10/28/24    Authorization Type Medicare    PT Start Time 1035    PT Stop Time 1115    PT Time Calculation (min) 40 min    Activity Tolerance Patient tolerated treatment well    Behavior During Therapy [facility] for tasks assessed/performed          Past Medical History:  Diagnosis Date   Asthma    Depression    Osteoarthritis    Past Surgical History:  Procedure Laterality Date   APPENDECTOMY     BIOPSY  05/24/2023   Procedure: BIOPSY;  Surgeon: Cindie Carlin POUR, DO;  [facility]: [facility];  Service: Endoscopy;;   ESOPHAGOGASTRODUODENOSCOPY (EGD) WITH PROPOFOL  N/A 05/24/2023   Procedure: ESOPHAGOGASTRODUODENOSCOPY (EGD) WITH PROPOFOL ;  Surgeon: Cindie Carlin POUR, DO;  [facility];  Service: Endoscopy;  Laterality: N/A;  12:30 pm, asa 2   Patient Active Problem List   Diagnosis Date Noted   Allergic rhinitis 07/24/2017   Asthma 07/24/2017   Hypertension 07/24/2017   Osteoarthritis 11/20/2008   DEPRESSION 09/30/2008   Asthma 09/30/2008   KNEE SPRAIN 09/30/2008   NECK SPRAIN AND STRAIN 09/30/2008    PCP: Myra Geni ORN, FNP  REFERRING PROVIDER: Yvone Rush, MD  REFERRING DIAG: L THA  THERAPY DIAG:  Difficulty in walking, not elsewhere classified  History of total hip arthroplasty, left  Rationale for Evaluation and Treatment: Rehabilitation  ONSET DATE: 09/11/2024  SUBJECTIVE:   SUBJECTIVE STATEMENT: L ant THR needs to start 09/16/24 gait training; surgery 09/11/24; concerned about swelling and doing too much; is doing some walking at home; arrives with RW  PERTINENT HISTORY: none PAIN:  Are you having pain? Yes: NPRS scale: 4-5/10 today Pain location:  left hip Pain description: tight, sore Aggravating factors: as the day progresses Relieving factors: ice  PRECAUTIONS: Anterior hip    WEIGHT BEARING RESTRICTIONS: No  FALLS:  Has patient fallen in last 6 months? No   OCCUPATION: retired  PLOF: Independent  PATIENT GOALS: want to not have to come  NEXT MD VISIT: unsure but next week sometime  OBJECTIVE:  Note: Objective measures were completed at Evaluation unless otherwise noted.  DIAGNOSTIC FINDINGS:   PATIENT SURVEYS:  LEFS  Extreme difficulty/unable (0), Quite a bit of difficulty (1), Moderate difficulty (2), Little difficulty (3), No difficulty (4) Survey date:    Any of your usual work, housework or school activities   2. Usual hobbies, recreational or sporting activities   3. Getting into/out of the bath   4. Walking between rooms   5. Putting on socks/shoes   6. Squatting    7. Lifting an object, like a bag of groceries from the floor   8. Performing light activities around your home   9. Performing heavy activities around your home   10. Getting into/out of a car   11. Walking 2 blocks   12. Walking 1 mile   13. Going up/down 10 stairs (1 flight)   14. Standing for 1 hour   15.  sitting for 1 hour   16. Running on even ground   17. Running on uneven ground   18. Making sharp turns while running  fast   19. Hopping    20. Rolling over in bed   Score total:  28/80; 35%     COGNITION: Overall cognitive status: Within functional limits for tasks assessed     SENSATION: WFL  EDEMA:  Yes left hip    PALPATION: Tenderness anterior hip normal for this time s/p; waterproof bandage in place.    LOWER EXTREMITY ROM:  Active ROM Right eval Left eval  Hip flexion    Hip extension    Hip abduction    Hip adduction    Hip internal rotation    Hip external rotation    Knee flexion    Knee extension    Ankle dorsiflexion    Ankle plantarflexion    Ankle inversion    Ankle eversion      (Blank rows = not tested)  LOWER EXTREMITY MMT:  MMT Right eval Left eval  Hip flexion    Hip extension    Hip abduction    Hip adduction    Hip internal rotation    Hip external rotation    Knee flexion  126 *tight  Knee extension    Ankle dorsiflexion    Ankle plantarflexion    Ankle inversion    Ankle eversion     (Blank rows = not tested)  FUNCTIONAL TESTS:  5 times sit to stand: 13.16 sec  Timed up and go (TUG): 12.44 sec with RW 2 minute walk test: 482 ft with RW  GAIT: Distance walked: 482 ft with RW Assistive device utilized: Environmental consultant - 2 wheeled Level of assistance: Modified independence Comments: slight antalgic gait; decreased stance left lower extremity                                                                                                                                TREATMENT DATE: 09/16/24 physical therapy evaluation and HEP instruction    PATIENT EDUCATION:  Education details: Patient educated on exam findings, POC, scope of PT, HEP, and what to expect next visit. Person educated: Patient Education method: Explanation, Demonstration, and Handouts Education comprehension: verbalized understanding, returned demonstration, verbal cues required, and tactile cues required   HOME EXERCISE PROGRAM: Access Code: [email or web address] URL: [email or web address] Date: 09/16/2024 Prepared by: [facility]  Exercises - Supine Ankle Pumps  - 2 x daily - 7 x weekly - 2 sets - 10 reps - Supine Quad Set  - 2 x daily - 7 x weekly - 2 sets - 10 reps - 5 sec hold - Supine Knee Extension Strengthening  - 2 x daily - 7 x weekly - 2 sets - 10 reps - Supine Heel Slide  - 2 x daily - 7 x weekly - 1 sets - 10 reps - Standing March with Counter Support  - 2 x daily - 7 x weekly - 2 sets - 10 reps - Heel Raises with Counter Support  - 2 x daily - 7 x  weekly - 2 sets - 10 reps  ASSESSMENT:  CLINICAL IMPRESSION: Patient is a 73 y.o. male who was seen today for  physical therapy evaluation and treatment for L ant THR needs to start 09/16/24 gait training.  Patient demonstrates muscle weakness, reduced ROM, and fascial restrictions which are likely contributing to symptoms of pain and are negatively impacting patient ability to perform ADLs and functional mobility tasks. Patient will benefit from skilled physical therapy services to address these deficits to reduce pain and improve level of function with ADLs and functional mobility tasks.   OBJECTIVE IMPAIRMENTS: Abnormal gait, decreased balance, increased fascial restrictions, impaired perceived functional ability, and pain.   ACTIVITY LIMITATIONS: carrying, lifting, bending, sitting, standing, squatting, sleeping, stairs, transfers, bed mobility, locomotion level, and caring for others  PARTICIPATION LIMITATIONS: meal prep, cleaning, laundry, driving, shopping, and yard work    [facility] POTENTIAL: Good  CLINICAL DECISION MAKING: Evolving/moderate complexity  EVALUATION COMPLEXITY: Moderate   GOALS: Goals reviewed with patient? No  SHORT TERM GOALS: Target date: 10/07/2024 patient will be independent with initial HEP  Baseline: Goal status: INITIAL  2.  Patient will report 50% improvement overall  Baseline:  Goal status: INITIAL  LONG TERM GOALS: Target date: 10/28/2024  Patient will be independent in self management strategies to improve quality of life and functional outcomes.  Baseline:  Goal status: INITIAL  2.  Patient will report 70% improvement overall  Baseline:  Goal status: INITIAL  3.  Patient will improve LEFS score by 20 points to demonstrate improved perceived function  Baseline: 28/80 Goal status: INITIAL  4.  Patient will improve TUG score to 10 sec or less to demonstrate improved functional mobility and decreased fall risk  Baseline: 12.44 sec Goal status: INITIAL  5.  Patient will improve 5 times sit to stand score to 12 sec or less to demonstrate  improved functional mobility and increased leg strength.    Baseline: 13.16 sec Goal status: INITIAL  PLAN:  PT FREQUENCY: 2x/week  PT DURATION: 6 weeks  PLANNED INTERVENTIONS: 97164- PT Re-evaluation, 97110-Therapeutic exercises, 97530- Therapeutic activity, 97112- Neuromuscular re-education, 97535- Self Care, 02859- Manual therapy, [id number]- Gait training, [phone]- Orthotic Fit/training, [phone]- Canalith repositioning, [id number]- Aquatic Therapy, [phone]- Splinting, [phone]- Wound care (first 20 sq cm), 97598- Wound care (each additional 20 sq cm)Patient/Family education, Balance training, Stair training, Taping, Dry Needling, Joint mobilization, Joint manipulation, Spinal manipulation, Spinal mobilization, Scar mobilization, and DME instructions.   PLAN FOR NEXT SESSION: Review HEP and goals; left hip mobility and strength; strength testing next visit; gait training to [facility] when appropriate; balance   12:31 PM, 09/16/24 Felisa Zechman Small Marva Hendryx MPT [place] [facility] [place] [phone] Ph:[phone]

## 2024-09-16 ENCOUNTER — Other Ambulatory Visit: Payer: Self-pay

## 2024-09-16 ENCOUNTER — Ambulatory Visit (HOSPITAL_COMMUNITY): Attending: Orthopedic Surgery

## 2024-09-16 DIAGNOSIS — R262 Difficulty in walking, not elsewhere classified: Secondary | ICD-10-CM | POA: Insufficient documentation

## 2024-09-16 DIAGNOSIS — Z96642 Presence of left artificial hip joint: Secondary | ICD-10-CM | POA: Insufficient documentation

## 2024-09-18 ENCOUNTER — Ambulatory Visit (HOSPITAL_COMMUNITY): Admitting: Physical Therapy

## 2024-09-18 DIAGNOSIS — R262 Difficulty in walking, not elsewhere classified: Secondary | ICD-10-CM | POA: Diagnosis not present

## 2024-09-18 DIAGNOSIS — Z96642 Presence of left artificial hip joint: Secondary | ICD-10-CM

## 2024-09-18 NOTE — Therapy (Signed)
 OUTPATIENT PHYSICAL THERAPY LOWER EXTREMITY EVALUATION   Patient Name: Donald Melendez MRN: [id number] DOB:June 18, 1951, 73 y.o., male Today's Date: 09/18/2024  END OF SESSION:  PT End of Session - 09/18/24 1505     Visit Number 2    Number of Visits 12    Date for Recertification  10/28/24    Authorization Type Medicare    Progress Note Due on Visit 10    PT Start Time 1420    PT Stop Time 1504    PT Time Calculation (min) 44 min    Activity Tolerance Patient tolerated treatment well    Behavior During Therapy [facility] for tasks assessed/performed           Past Medical History:  Diagnosis Date   Asthma    Depression    Osteoarthritis    Past Surgical History:  Procedure Laterality Date   APPENDECTOMY     BIOPSY  05/24/2023   Procedure: BIOPSY;  Surgeon: Cindie Carlin POUR, DO;  [facility];  Service: Endoscopy;;   ESOPHAGOGASTRODUODENOSCOPY (EGD) WITH PROPOFOL  N/A 05/24/2023   Procedure: ESOPHAGOGASTRODUODENOSCOPY (EGD) WITH PROPOFOL ;  Surgeon: Cindie Carlin POUR, DO;  [facility];  Service: Endoscopy;  Laterality: N/A;  12:30 pm, asa 2   Patient Active Problem List   Diagnosis Date Noted   Allergic rhinitis 07/24/2017   Asthma 07/24/2017   Hypertension 07/24/2017   Osteoarthritis 11/20/2008   DEPRESSION 09/30/2008   Asthma 09/30/2008   KNEE SPRAIN 09/30/2008   NECK SPRAIN AND STRAIN 09/30/2008    PCP: Myra Geni ORN, FNP  REFERRING PROVIDER: Yvone Rush, MD  REFERRING DIAG: L THA  THERAPY DIAG:  Difficulty in walking, not elsewhere classified  History of total hip arthroplasty, left  Rationale for Evaluation and Treatment: Rehabilitation  ONSET DATE: 09/11/2024  SUBJECTIVE:   SUBJECTIVE STATEMENT:  09/18/24:  Pain currently 4-5/10.  States night is worst with difficulty getting to sleep and staying asleep.  Comes today with RW. Reports compliance with HEP and walking inside his house daily.   Evaluation:  L ant THR  needs to start 09/16/24 gait training; surgery 09/11/24; concerned about swelling and doing too much; is doing some walking at home; arrives with RW  PERTINENT HISTORY: none PAIN:  Are you having pain? Yes: NPRS scale: 4-5/10 today Pain location: left hip Pain description: tight, sore Aggravating factors: as the day progresses Relieving factors: ice  PRECAUTIONS: Anterior hip    WEIGHT BEARING RESTRICTIONS: No  FALLS:  Has patient fallen in last 6 months? No   OCCUPATION: retired  PLOF: Independent  PATIENT GOALS: want to not have to come  NEXT MD VISIT: unsure but next week sometime  OBJECTIVE:  Note: Objective measures were completed at Evaluation unless otherwise noted.  DIAGNOSTIC FINDINGS:   PATIENT SURVEYS:  LEFS  Extreme difficulty/unable (0), Quite a bit of difficulty (1), Moderate difficulty (2), Little difficulty (3), No difficulty (4) Survey date:   EVAL  Score total:  28/80; 35%     COGNITION: Overall cognitive status: Within functional limits for tasks assessed     SENSATION: WFL  EDEMA:  Yes left hip    PALPATION: Tenderness anterior hip normal for this time s/p; waterproof bandage in place.    LOWER EXTREMITY ROM:  Active ROM Right eval Left eval  Hip flexion    Hip extension    Hip abduction    Hip adduction    Hip internal rotation    Hip external rotation  Knee flexion  126 tight  Knee extension    Ankle dorsiflexion    Ankle plantarflexion    Ankle inversion    Ankle eversion     (Blank rows = not tested)  LOWER EXTREMITY MMT:  MMT Right 09/18/24 Left 09/18/24  Hip flexion 4 3  Hip extension    Hip abduction 5 3-  Hip adduction    Hip internal rotation    Hip external rotation    Knee flexion 5 5  Knee extension 5 5  Ankle dorsiflexion    Ankle plantarflexion    Ankle inversion    Ankle eversion     (Blank rows = not tested)  FUNCTIONAL TESTS:  5 times sit to stand: 13.16 sec  Timed up and go (TUG):  12.44 sec with RW 2 minute walk test: 482 ft with RW  GAIT: Distance walked: 482 ft with RW Assistive device utilized: Environmental consultant - 2 wheeled Level of assistance: Modified independence Comments: slight antalgic gait; decreased stance left lower extremity                                                                                                                                TREATMENT DATE:  09/18/24 Goal review/education on edema control  Ambulation SPC, 1 RT around gym MMT (see above chart) Standing: heelraises 20X  Hip abduction 10X each  Marching alternating 10X each     09/16/24 physical therapy evaluation and HEP instruction    PATIENT EDUCATION:  Education details: Patient educated on exam findings, POC, scope of PT, HEP, and what to expect next visit. Person educated: Patient Education method: Explanation, Demonstration, and Handouts Education comprehension: verbalized understanding, returned demonstration, verbal cues required, and tactile cues required   HOME EXERCISE PROGRAM: Access Code: [email or web address] URL: [email or web address] Date: 09/16/2024 Prepared by: [facility] - [facility]  Exercises - Supine Ankle Pumps  - 2 x daily - 7 x weekly - 2 sets - 10 reps - Supine Quad Set  - 2 x daily - 7 x weekly - 2 sets - 10 reps - 5 sec hold - Supine Knee Extension Strengthening  - 2 x daily - 7 x weekly - 2 sets - 10 reps - Supine Heel Slide  - 2 x daily - 7 x weekly - 1 sets - 10 reps - Standing March with Counter Support  - 2 x daily - 7 x weekly - 2 sets - 10 reps - Heel Raises with Counter Support  - 2 x daily - 7 x weekly - 2 sets - 10 reps  ASSESSMENT:  CLINICAL IMPRESSION: Reviewed goals and POC moving forward.  Completed MMT per evaluation with most weakness in LT hip flexor.  Completed gait training with SPC today.  Overall good cadence and sequencing.  Cues to keep cane further out to side as sometimes kicks with Rt foot.  Pt educated on managing his edema  for pain control and instructed to check  with MD regarding use of arthritis tylenol  or other nsaids instead of narcotic pain meds. Pt with questions regarding HEP as appears may be completing more than 2X daily.  Pt educated on this and to keep activity before pain.  No new exercises added to HEP today.  Patient will benefit from skilled physical therapy services to address these deficits to reduce pain and improve level of function with ADLs and functional mobility tasks.   OBJECTIVE IMPAIRMENTS: Abnormal gait, decreased balance, increased fascial restrictions, impaired perceived functional ability, and pain.   ACTIVITY LIMITATIONS: carrying, lifting, bending, sitting, standing, squatting, sleeping, stairs, transfers, bed mobility, locomotion level, and caring for others  PARTICIPATION LIMITATIONS: meal prep, cleaning, laundry, driving, shopping, and yard work    [facility] POTENTIAL: Good  CLINICAL DECISION MAKING: Evolving/moderate complexity  EVALUATION COMPLEXITY: Moderate   GOALS: Goals reviewed with patient? No  SHORT TERM GOALS: Target date: 10/07/2024 patient will be independent with initial HEP  Baseline: Goal status: MET  2.  Patient will report 50% improvement overall  Baseline:  Goal status: IN PROGRESS  LONG TERM GOALS: Target date: 10/28/2024  Patient will be independent in self management strategies to improve quality of life and functional outcomes.  Baseline:  Goal status: IN PROGRESS  2.  Patient will report 70% improvement overall  Baseline:  Goal status: IN PROGRESS  3.  Patient will improve LEFS score by 20 points to demonstrate improved perceived function  Baseline: 28/80 Goal status: IN PROGRESS  4.  Patient will improve TUG score to 10 sec or less to demonstrate improved functional mobility and decreased fall risk  Baseline: 12.44 sec Goal status: IN PROGRESS  5.  Patient will improve 5 times sit to stand score to 12 sec or less to demonstrate  improved functional mobility and increased leg strength.    Baseline: 13.16 sec Goal status: IN PROGRESS  PLAN:  PT FREQUENCY: 2x/week  PT DURATION: 6 weeks  PLANNED INTERVENTIONS: 97164- PT Re-evaluation, 97110-Therapeutic exercises, 97530- Therapeutic activity, 97112- Neuromuscular re-education, 97535- Self Care, 02859- Manual therapy, [phone]- Gait training, [phone]- Orthotic Fit/training, [phone]- Canalith repositioning, [id number]- Aquatic Therapy, [phone]- Splinting, [phone]- Wound care (first 20 sq cm), 97598- Wound care (each additional 20 sq cm)Patient/Family education, Balance training, Stair training, Taping, Dry Needling, Joint mobilization, Joint manipulation, Spinal manipulation, Spinal mobilization, Scar mobilization, and DME instructions.   PLAN FOR NEXT SESSION: continue to improve hip mobility,  strength and balance.   Progress gait to no AD as able.   4:54 PM, 09/18/24 Greig KATHEE Fuse, PTA/CLT [facility] Ph: [phone]

## 2024-09-25 ENCOUNTER — Ambulatory Visit (HOSPITAL_COMMUNITY)

## 2024-09-25 DIAGNOSIS — R262 Difficulty in walking, not elsewhere classified: Secondary | ICD-10-CM | POA: Diagnosis not present

## 2024-09-25 DIAGNOSIS — Z96642 Presence of left artificial hip joint: Secondary | ICD-10-CM

## 2024-09-25 NOTE — Therapy (Signed)
 OUTPATIENT PHYSICAL THERAPY LOWER EXTREMITY TREATMENT/DISCHARGE PHYSICAL THERAPY DISCHARGE SUMMARY  Visits from Start of Care: 3  Current functional level related to goals / functional outcomes: See below   Remaining deficits: See below   Education / Equipment: HEP   Patient agrees to discharge. Patient goals were met. Patient is being discharged due to meeting the stated rehab goals.    Patient Name: Donald Melendez MRN: [id number] DOB:July 28, 1951, 73 y.o., male Today's Date: 09/25/2024  END OF SESSION:  PT End of Session - 09/25/24 0901     Visit Number 3    Number of Visits 12    Date for Recertification  10/28/24    Authorization Type Medicare    Progress Note Due on Visit 10    PT Start Time 0902    PT Stop Time 0936    PT Time Calculation (min) 34 min    Activity Tolerance Patient tolerated treatment well    Behavior During Therapy [facility] for tasks assessed/performed           Past Medical History:  Diagnosis Date   Asthma    Depression    Osteoarthritis    Past Surgical History:  Procedure Laterality Date   APPENDECTOMY     BIOPSY  05/24/2023   Procedure: BIOPSY;  Surgeon: Cindie Carlin POUR, DO;  Location: [facility];  Service: Endoscopy;;   ESOPHAGOGASTRODUODENOSCOPY (EGD) WITH PROPOFOL  N/A 05/24/2023   Procedure: ESOPHAGOGASTRODUODENOSCOPY (EGD) WITH PROPOFOL ;  Surgeon: Cindie Carlin POUR, DO;  Location: [facility];  Service: Endoscopy;  Laterality: N/A;  12:30 pm, asa 2   Patient Active Problem List   Diagnosis Date Noted   Allergic rhinitis 07/24/2017   Asthma 07/24/2017   Hypertension 07/24/2017   Osteoarthritis 11/20/2008   DEPRESSION 09/30/2008   Asthma 09/30/2008   KNEE SPRAIN 09/30/2008   NECK SPRAIN AND STRAIN 09/30/2008    PCP: Myra Geni ORN, FNP  REFERRING PROVIDER: Yvone Rush, MD  REFERRING DIAG: L THA  THERAPY DIAG:  Difficulty in walking, not elsewhere classified  History of total hip arthroplasty,  left  Rationale for Evaluation and Treatment: Rehabilitation  ONSET DATE: 09/11/2024  SUBJECTIVE:   SUBJECTIVE STATEMENT: Patient arrives without AD this morning; 1-2/10 pain; doing ok with exercises at home.  Returns to MD in 4 weeks; feels he is 80% better  Evaluation:  L ant THR needs to start 09/16/24 gait training; surgery 09/11/24; concerned about swelling and doing too much; is doing some walking at home; arrives with RW  PERTINENT HISTORY: none PAIN:  Are you having pain? Yes: NPRS scale: 4-5/10 today Pain location: left hip Pain description: tight, sore Aggravating factors: as the day progresses Relieving factors: ice  PRECAUTIONS: Anterior hip    WEIGHT BEARING RESTRICTIONS: No  FALLS:  Has patient fallen in last 6 months? No   OCCUPATION: retired  PLOF: Independent  PATIENT GOALS: want to not have to come  NEXT MD VISIT: unsure but next week sometime  OBJECTIVE:  Note: Objective measures were completed at Evaluation unless otherwise noted.  DIAGNOSTIC FINDINGS:   PATIENT SURVEYS:  LEFS  Extreme difficulty/unable (0), Quite a bit of difficulty (1), Moderate difficulty (2), Little difficulty (3), No difficulty (4) Survey date:   EVAL  Score total:  28/80; 35%     COGNITION: Overall cognitive status: Within functional limits for tasks assessed     SENSATION: WFL  EDEMA:  Yes left hip    PALPATION: Tenderness anterior hip normal for this time s/p; waterproof  bandage in place.    LOWER EXTREMITY ROM:  Active ROM Right eval Left eval  Hip flexion    Hip extension    Hip abduction    Hip adduction    Hip internal rotation    Hip external rotation    Knee flexion  126 tight  Knee extension    Ankle dorsiflexion    Ankle plantarflexion    Ankle inversion    Ankle eversion     (Blank rows = not tested)  LOWER EXTREMITY MMT:  MMT Right 09/18/24 Left 09/18/24  Hip flexion 4 3  Hip extension    Hip abduction 5 3-  Hip  adduction    Hip internal rotation    Hip external rotation    Knee flexion 5 5  Knee extension 5 5  Ankle dorsiflexion    Ankle plantarflexion    Ankle inversion    Ankle eversion     (Blank rows = not tested)  FUNCTIONAL TESTS:  5 times sit to stand: 13.16 sec  Timed up and go (TUG): 12.44 sec with RW 2 minute walk test: 482 ft with RW  GAIT: Distance walked: 482 ft with RW Assistive device utilized: Environmental consultant - 2 wheeled Level of assistance: Modified independence Comments: slight antalgic gait; decreased stance left lower extremity                                                                                                                                TREATMENT DATE:  09/25/24 1 lap around PT gym without AD Toe raises on incline x 10 Marching x 10 Left Hip abduction x 10 Left Hamstring curls x 10 Squat to chair LEFS 54/80  09/18/24 Goal review/education on edema control  Ambulation SPC, 1 RT around gym MMT (see above chart) Standing: heelraises 20X  Hip abduction 10X each  Marching alternating 10X each     09/16/24 physical therapy evaluation and HEP instruction    PATIENT EDUCATION:  Education details: Patient educated on exam findings, POC, scope of PT, HEP, and what to expect next visit. Person educated: Patient Education method: Explanation, Demonstration, and Handouts Education comprehension: verbalized understanding, returned demonstration, verbal cues required, and tactile cues required   HOME EXERCISE PROGRAM: Access Code: [email or web address] URL: [email or web address] Date: 09/16/2024 Prepared by: [facility] - Rehab  Exercises - Supine Ankle Pumps  - 2 x daily - 7 x weekly - 2 sets - 10 reps - Supine Quad Set  - 2 x daily - 7 x weekly - 2 sets - 10 reps - 5 sec hold - Supine Knee Extension Strengthening  - 2 x daily - 7 x weekly - 2 sets - 10 reps - Supine Heel Slide  - 2 x daily - 7 x weekly - 1 sets - 10 reps - Standing March with Counter  Support  - 2 x daily - 7 x weekly - 2 sets - 10 reps - Heel Raises with  Counter Support  - 2 x daily - 7 x weekly - 2 sets - 10 reps  ASSESSMENT:  CLINICAL IMPRESSION: Started session with 1 lap around the PT gym without AD; normalizing gait pattern.  Still has some swelling in the hip but was able to wear jeans and belt today.  Quick goal review/ progress note as patient is really doing so well and he has met all set rehab goals and is agreeable to discharge.  Updated HEP and gave patient a walking program.     OBJECTIVE IMPAIRMENTS: Abnormal gait, decreased balance, increased fascial restrictions, impaired perceived functional ability, and pain.   ACTIVITY LIMITATIONS: carrying, lifting, bending, sitting, standing, squatting, sleeping, stairs, transfers, bed mobility, locomotion level, and caring for others  PARTICIPATION LIMITATIONS: meal prep, cleaning, laundry, driving, shopping, and yard work    [facility] POTENTIAL: Good  CLINICAL DECISION MAKING: Evolving/moderate complexity  EVALUATION COMPLEXITY: Moderate   GOALS: Goals reviewed with patient? No  SHORT TERM GOALS: Target date: 10/07/2024 patient will be independent with initial HEP  Baseline: Goal status: MET  2.  Patient will report 50% improvement overall  Baseline:  Goal status: met  LONG TERM GOALS: Target date: 10/28/2024  Patient will be independent in self management strategies to improve quality of life and functional outcomes.  Baseline:  Goal status: IN PROGRESS  2.  Patient will report 70% improvement overall  Baseline:  Goal status: met  3.  Patient will improve LEFS score by 20 points to demonstrate improved perceived function  Baseline: 28/80; 54/80 Goal status: met  4.  Patient will improve TUG score to 10 sec or less to demonstrate improved functional mobility and decreased fall risk  Baseline: 12.44 sec Goal status: 8.23 sec  5.  Patient will improve 5 times sit to stand score to 12  sec or less to demonstrate improved functional mobility and increased leg strength.    Baseline: 13.16 sec; 6.47 sec Goal status: met  PLAN:  PT FREQUENCY: 2x/week  PT DURATION: 6 weeks  PLANNED INTERVENTIONS: 97164- PT Re-evaluation, 97110-Therapeutic exercises, 97530- Therapeutic activity, 97112- Neuromuscular re-education, 97535- Self Care, [id number]- Manual therapy, [id number]- Gait training, [phone]- Orthotic Fit/training, [phone]- Canalith repositioning, [id number]- Aquatic Therapy, [phone]- Splinting, [phone]- Wound care (first 20 sq cm), 97598- Wound care (each additional 20 sq cm)Patient/Family education, Balance training, Stair training, Taping, Dry Needling, Joint mobilization, Joint manipulation, Spinal manipulation, Spinal mobilization, Scar mobilization, and DME instructions.   PLAN FOR NEXT SESSION: discharge 9:39 AM, 09/25/24 Kathyleen Radice Small Takari Duncombe MPT [facility] [place] [phone]

## 2024-10-03 ENCOUNTER — Encounter (HOSPITAL_COMMUNITY)

## 2024-10-04 ENCOUNTER — Encounter (HOSPITAL_COMMUNITY)

## 2024-10-09 ENCOUNTER — Encounter (HOSPITAL_COMMUNITY): Admitting: Physical Therapy

## 2024-10-09 ENCOUNTER — Encounter (HOSPITAL_COMMUNITY)

## 2024-10-11 ENCOUNTER — Encounter (HOSPITAL_COMMUNITY)

## 2024-10-15 ENCOUNTER — Encounter (HOSPITAL_COMMUNITY): Admitting: Physical Therapy

## 2024-10-17 ENCOUNTER — Encounter (HOSPITAL_COMMUNITY)

## 2024-10-21 ENCOUNTER — Encounter (HOSPITAL_COMMUNITY)

## 2024-10-24 ENCOUNTER — Encounter (HOSPITAL_COMMUNITY)

## 2024-10-28 ENCOUNTER — Encounter (HOSPITAL_COMMUNITY)

## 2024-10-30 ENCOUNTER — Encounter (HOSPITAL_COMMUNITY)

## 2024-11-04 ENCOUNTER — Encounter (HOSPITAL_COMMUNITY)

## 2024-11-07 ENCOUNTER — Encounter (HOSPITAL_COMMUNITY)
